# Patient Record
Sex: Female | Born: 1970 | Race: White | Hispanic: No | Marital: Married | Smoking: Never smoker
Health system: Southern US, Community
[De-identification: ages and names within clinical notes are randomized; demographics above are authoritative.]

## PROBLEM LIST (undated history)

## (undated) DIAGNOSIS — N83209 Unspecified ovarian cyst, unspecified side: Secondary | ICD-10-CM

## (undated) DIAGNOSIS — R1032 Left lower quadrant pain: Secondary | ICD-10-CM

## (undated) DIAGNOSIS — Z9889 Other specified postprocedural states: Secondary | ICD-10-CM

## (undated) DIAGNOSIS — J9801 Acute bronchospasm: Secondary | ICD-10-CM

## (undated) DIAGNOSIS — R112 Nausea with vomiting, unspecified: Secondary | ICD-10-CM

## (undated) DIAGNOSIS — Z87442 Personal history of urinary calculi: Secondary | ICD-10-CM

## (undated) DIAGNOSIS — R519 Headache, unspecified: Secondary | ICD-10-CM

## (undated) DIAGNOSIS — R5383 Other fatigue: Secondary | ICD-10-CM

## (undated) DIAGNOSIS — M797 Fibromyalgia: Secondary | ICD-10-CM

## (undated) DIAGNOSIS — R12 Heartburn: Secondary | ICD-10-CM

## (undated) DIAGNOSIS — K219 Gastro-esophageal reflux disease without esophagitis: Secondary | ICD-10-CM

## (undated) DIAGNOSIS — N390 Urinary tract infection, site not specified: Secondary | ICD-10-CM

## (undated) DIAGNOSIS — T8859XA Other complications of anesthesia, initial encounter: Secondary | ICD-10-CM

## (undated) DIAGNOSIS — I10 Essential (primary) hypertension: Secondary | ICD-10-CM

## (undated) DIAGNOSIS — N809 Endometriosis, unspecified: Secondary | ICD-10-CM

## (undated) DIAGNOSIS — G4730 Sleep apnea, unspecified: Secondary | ICD-10-CM

## (undated) DIAGNOSIS — Z8489 Family history of other specified conditions: Secondary | ICD-10-CM

## (undated) DIAGNOSIS — T4145XA Adverse effect of unspecified anesthetic, initial encounter: Secondary | ICD-10-CM

## (undated) HISTORY — DX: Heartburn: R12

## (undated) HISTORY — DX: Urinary tract infection, site not specified: N39.0

## (undated) HISTORY — PX: ESOPHAGOGASTRODUODENOSCOPY: SHX1529

## (undated) HISTORY — DX: Left lower quadrant pain: R10.32

## (undated) HISTORY — DX: Acute bronchospasm: J98.01

## (undated) HISTORY — PX: KNEE ARTHROSCOPY: SHX127

## (undated) HISTORY — PX: BUNIONECTOMY: SHX129

## (undated) HISTORY — DX: Other fatigue: R53.83

## (undated) HISTORY — DX: Gastro-esophageal reflux disease without esophagitis: K21.9

---

## 2005-11-17 ENCOUNTER — Ambulatory Visit: Payer: Self-pay

## 2007-06-28 ENCOUNTER — Ambulatory Visit: Payer: Self-pay | Admitting: Internal Medicine

## 2008-09-25 ENCOUNTER — Ambulatory Visit: Payer: Self-pay

## 2009-11-25 ENCOUNTER — Ambulatory Visit: Payer: Self-pay

## 2010-10-22 ENCOUNTER — Emergency Department: Payer: Self-pay | Admitting: Emergency Medicine

## 2010-11-26 ENCOUNTER — Ambulatory Visit: Payer: Self-pay

## 2011-08-09 ENCOUNTER — Ambulatory Visit: Payer: Self-pay | Admitting: Unknown Physician Specialty

## 2011-08-11 LAB — PATHOLOGY REPORT

## 2012-12-06 LAB — HM PAP SMEAR: HM PAP: NEGATIVE

## 2014-04-15 ENCOUNTER — Ambulatory Visit: Payer: Self-pay | Admitting: Obstetrics and Gynecology

## 2015-01-01 LAB — TSH: TSH: 1.2 u[IU]/mL (ref <=5.90)

## 2015-01-01 LAB — LIPID PANEL
Cholesterol: 172 mg/dL (ref 0–200)
HDL: 53 mg/dL (ref 35–70)
LDL CALC: 103 mg/dL
Triglycerides: 78 mg/dL (ref 40–160)

## 2015-01-01 LAB — BASIC METABOLIC PANEL: Glucose: 89 mg/dL

## 2015-03-15 ENCOUNTER — Encounter: Payer: Self-pay | Admitting: *Deleted

## 2015-03-17 ENCOUNTER — Ambulatory Visit: Payer: Self-pay | Admitting: Obstetrics and Gynecology

## 2015-03-18 ENCOUNTER — Ambulatory Visit: Payer: Self-pay | Admitting: Obstetrics and Gynecology

## 2015-03-23 ENCOUNTER — Ambulatory Visit (INDEPENDENT_AMBULATORY_CARE_PROVIDER_SITE_OTHER): Payer: BC Managed Care – PPO | Admitting: Obstetrics and Gynecology

## 2015-03-23 ENCOUNTER — Encounter: Payer: Self-pay | Admitting: Obstetrics and Gynecology

## 2015-03-23 VITALS — BP 123/79 | HR 62 | Ht 63.0 in | Wt 197.5 lb

## 2015-03-23 DIAGNOSIS — E559 Vitamin D deficiency, unspecified: Secondary | ICD-10-CM | POA: Diagnosis not present

## 2015-03-23 DIAGNOSIS — M2550 Pain in unspecified joint: Secondary | ICD-10-CM

## 2015-03-23 DIAGNOSIS — E669 Obesity, unspecified: Secondary | ICD-10-CM | POA: Diagnosis not present

## 2015-03-23 DIAGNOSIS — K219 Gastro-esophageal reflux disease without esophagitis: Secondary | ICD-10-CM | POA: Insufficient documentation

## 2015-03-23 MED ORDER — ASPIRIN EC 81 MG PO TBEC: 81.0000 mg | DELAYED_RELEASE_TABLET | Freq: Every day | ORAL | Status: DC

## 2015-03-23 MED ORDER — DICLOFENAC POTASSIUM 50 MG PO TABS: 50.0000 mg | ORAL_TABLET | Freq: Three times a day (TID) | ORAL | Status: DC

## 2015-03-23 MED ORDER — CYANOCOBALAMIN 1000 MCG/ML IJ SOLN: 1000.0000 ug | INTRAMUSCULAR | Status: DC

## 2015-03-23 MED ORDER — SCOPOLAMINE 1 MG/3DAYS TD PT72: 1.0000 | MEDICATED_PATCH | TRANSDERMAL | Status: DC

## 2015-03-23 MED ORDER — PHENTERMINE HCL 37.5 MG PO TABS: 37.5000 mg | ORAL_TABLET | Freq: Every day | ORAL | Status: DC

## 2015-03-23 NOTE — Progress Notes (Signed)
Subjective:     Patient ID: Shannon Ross, female   DOB: 06/07/1971, 44 y.o.   MRN: [id number]  HPI Reports desire to restart weight loss program. Mother recently had stroke and multiple old TIAs noted. Understands importance of health maintnance.  Review of Systems Reports worsening GERD since weight is back on. Also has moderate joint pain and stiffness with increase leg achiness in am.    Objective:   Physical Exam A&O x4 Well groomed obese female not in distress. No joint swelling noted    Assessment:     Obesity, strong interests in weight loss Generalized am joint pain GERD     Plan:     Restart B12 and adipex- rx given  To reviewed cholesterol in diet on American Heart association website.  Diclofinac 50mg  tid prn for joint pain  Nymir Ringler Ines BloomerBurr, CNM

## 2015-03-24 ENCOUNTER — Telehealth: Payer: Self-pay | Admitting: *Deleted

## 2015-03-24 LAB — VITAMIN B12: Vitamin B-12: 748 pg/mL (ref 211–946)

## 2015-03-24 LAB — RHEUMATOID FACTOR: RHEUMATOID FACTOR: 7.1 [IU]/mL (ref 0.0–13.9)

## 2015-03-24 NOTE — Telephone Encounter (Signed)
-----   Message from Ulyses AmorMelody N Burr, [place] sent at 03/24/2015  8:13 AM EDT ----- Please let her know B12 levels are good and rhuematiod factor was negative

## 2015-03-24 NOTE — Telephone Encounter (Signed)
Notified pt of results 

## 2015-04-20 ENCOUNTER — Ambulatory Visit (INDEPENDENT_AMBULATORY_CARE_PROVIDER_SITE_OTHER): Payer: BC Managed Care – PPO | Admitting: Obstetrics and Gynecology

## 2015-04-20 VITALS — BP 131/82 | HR 76 | Ht 63.0 in | Wt 190.4 lb

## 2015-04-20 DIAGNOSIS — E669 Obesity, unspecified: Secondary | ICD-10-CM | POA: Diagnosis not present

## 2015-04-20 MED ORDER — CYANOCOBALAMIN 1000 MCG/ML IJ SOLN
1000.0000 ug | Freq: Once | INTRAMUSCULAR | Status: AC
  Administered 2015-04-20: 1000 ug via INTRAMUSCULAR

## 2015-04-20 NOTE — Progress Notes (Cosign Needed)
Patient ID: Shannon Ross, female   DOB: July 15, 1971, 44 y.o.   MRN: [id number] Pt voices no complaints. Taking medications without adverse side effects.

## 2015-05-18 ENCOUNTER — Ambulatory Visit (INDEPENDENT_AMBULATORY_CARE_PROVIDER_SITE_OTHER): Payer: BC Managed Care – PPO | Admitting: Obstetrics and Gynecology

## 2015-05-18 VITALS — BP 117/80 | HR 79 | Ht 63.0 in | Wt 189.2 lb

## 2015-05-18 DIAGNOSIS — E669 Obesity, unspecified: Secondary | ICD-10-CM

## 2015-05-18 MED ORDER — CYANOCOBALAMIN 1000 MCG/ML IJ SOLN
1000.0000 ug | Freq: Once | INTRAMUSCULAR | Status: AC
  Administered 2015-05-18: 1000 ug via INTRAMUSCULAR

## 2015-05-18 NOTE — Progress Notes (Cosign Needed)
Patient ID: Shannon Ross, female   DOB: [birth year], 45 y.o.   MRN: [id number] Pt here for weight, B/P, B-12 injection.  Weight loss of 1 lb since last visit. No c/o side effects of medication.

## 2015-05-19 ENCOUNTER — Other Ambulatory Visit: Payer: Self-pay | Admitting: Obstetrics and Gynecology

## 2015-05-27 ENCOUNTER — Ambulatory Visit (INDEPENDENT_AMBULATORY_CARE_PROVIDER_SITE_OTHER): Payer: BC Managed Care – PPO | Admitting: Obstetrics and Gynecology

## 2015-05-27 VITALS — BP 140/73 | HR 93 | Temp 97.8°F | Ht 63.0 in | Wt 190.1 lb

## 2015-05-27 DIAGNOSIS — R3915 Urgency of urination: Secondary | ICD-10-CM | POA: Diagnosis not present

## 2015-05-27 DIAGNOSIS — N390 Urinary tract infection, site not specified: Secondary | ICD-10-CM

## 2015-05-27 DIAGNOSIS — R102 Pelvic and perineal pain: Secondary | ICD-10-CM

## 2015-05-27 LAB — POCT URINALYSIS DIPSTICK
BILIRUBIN UA: NEGATIVE
GLUCOSE UA: NEGATIVE
Ketones, UA: NEGATIVE
Nitrite, UA: NEGATIVE
Protein, UA: NEGATIVE
SPEC GRAV UA: 1.025
Urobilinogen, UA: NEGATIVE
pH, UA: 6

## 2015-05-27 MED ORDER — NITROFURANTOIN MONOHYD MACRO 100 MG PO CAPS: 100.0000 mg | ORAL_CAPSULE | Freq: Two times a day (BID) | ORAL | Status: DC

## 2015-05-27 NOTE — Progress Notes (Cosign Needed)
Patient ID: Shannon Ross, female   DOB: 1971/07/17, 44 y.o.   MRN: [id number] Pt comes to office to have urinalysis done per MNB. Pt c/o urinary urgency, pelvic pain that started about mid morning all of a sudden. Pt prone to UTI'S. Given urogesic for pain and MNB will address upon returning to office today.

## 2015-05-29 LAB — URINE CULTURE

## 2015-05-31 ENCOUNTER — Telehealth: Payer: Self-pay | Admitting: Obstetrics and Gynecology

## 2015-05-31 NOTE — Telephone Encounter (Signed)
MNB spoke w/pt she is to finish current antibotic and to drop off UA in 3 days

## 2015-05-31 NOTE — Telephone Encounter (Signed)
Severe abdominal pain, not having urge to go, wanted to know the results of her urinalysis. Pt would like a call back.

## 2015-06-04 ENCOUNTER — Other Ambulatory Visit: Payer: Self-pay | Admitting: Obstetrics and Gynecology

## 2015-06-04 ENCOUNTER — Telehealth: Payer: Self-pay | Admitting: Obstetrics and Gynecology

## 2015-06-04 MED ORDER — AMOXICILLIN-POT CLAVULANATE 875-125 MG PO TABS: 1.0000 | ORAL_TABLET | Freq: Two times a day (BID) | ORAL | Status: DC

## 2015-06-04 NOTE — Telephone Encounter (Signed)
PT CALLED AND SHE FINISHED THE ANTIBIOTIC YESTERDAY AND SHE IS STILL HAVING BURNING WHEN SHE URINATES AND ABDOMINAL PAIN. Shannon Ross

## 2015-06-15 ENCOUNTER — Ambulatory Visit: Payer: BC Managed Care – PPO

## 2015-06-16 ENCOUNTER — Ambulatory Visit (INDEPENDENT_AMBULATORY_CARE_PROVIDER_SITE_OTHER): Payer: BC Managed Care – PPO | Admitting: Obstetrics and Gynecology

## 2015-06-16 VITALS — BP 118/72 | HR 73 | Wt 186.8 lb

## 2015-06-16 DIAGNOSIS — E669 Obesity, unspecified: Secondary | ICD-10-CM | POA: Diagnosis not present

## 2015-06-16 MED ORDER — CYANOCOBALAMIN 1000 MCG/ML IJ SOLN
1000.0000 ug | Freq: Once | INTRAMUSCULAR | Status: AC
  Administered 2015-06-16: 1000 ug via INTRAMUSCULAR

## 2015-06-16 NOTE — Progress Notes (Cosign Needed)
Pt is here for wt, bp check, b-12 inj She is having some edema in her LE, advised pt i would discuss with MNB about fluid pill  Wt 186.8lb

## 2015-06-29 ENCOUNTER — Other Ambulatory Visit (INDEPENDENT_AMBULATORY_CARE_PROVIDER_SITE_OTHER): Payer: BC Managed Care – PPO | Admitting: Obstetrics and Gynecology

## 2015-06-29 DIAGNOSIS — R350 Frequency of micturition: Secondary | ICD-10-CM

## 2015-06-29 LAB — POCT URINALYSIS DIPSTICK
BILIRUBIN UA: NEGATIVE
GLUCOSE UA: NEGATIVE
KETONES UA: NEGATIVE
LEUKOCYTES UA: NEGATIVE
NITRITE UA: NEGATIVE
Spec Grav, UA: 1.025
Urobilinogen, UA: 0.2
pH, UA: 6

## 2015-06-29 NOTE — Progress Notes (Cosign Needed)
Pt is having frequent urination, lots of pressure when urinating started this am @ 8:30 am  Called pt advised we would sent out culture, didn't see a infection today, she is going to use Urogesic Blue until culture comes back

## 2015-06-30 ENCOUNTER — Other Ambulatory Visit: Payer: Self-pay | Admitting: *Deleted

## 2015-06-30 MED ORDER — CIPROFLOXACIN HCL 500 MG PO TABS: 500.0000 mg | ORAL_TABLET | Freq: Two times a day (BID) | ORAL | Status: DC

## 2015-07-01 ENCOUNTER — Telehealth: Payer: Self-pay | Admitting: *Deleted

## 2015-07-01 LAB — URINE CULTURE

## 2015-07-01 NOTE — Telephone Encounter (Signed)
-----   Message from Ulyses AmorMelody N Burr, [place] sent at 07/01/2015  9:51 AM EDT ----- Please let her know her urine culture rsults- not the e cloi again, which is good, antibiotic should be helping

## 2015-07-01 NOTE — Telephone Encounter (Signed)
Notified pt of results, she isnt feeling much better, advised pt to call our office 07/02/15 if not any better

## 2015-07-02 ENCOUNTER — Other Ambulatory Visit: Payer: Self-pay | Admitting: *Deleted

## 2015-07-02 MED ORDER — LEVOFLOXACIN 500 MG PO TABS: 500.0000 mg | ORAL_TABLET | Freq: Every day | ORAL | Status: DC

## 2015-07-07 ENCOUNTER — Other Ambulatory Visit: Payer: Self-pay | Admitting: Obstetrics and Gynecology

## 2015-07-07 ENCOUNTER — Telehealth: Payer: Self-pay | Admitting: *Deleted

## 2015-07-07 DIAGNOSIS — R1032 Left lower quadrant pain: Secondary | ICD-10-CM

## 2015-07-07 DIAGNOSIS — N390 Urinary tract infection, site not specified: Secondary | ICD-10-CM

## 2015-07-07 NOTE — Telephone Encounter (Signed)
Notified pt of MNB plan she is in agreement and will come in 07/08/15 for ua/culture

## 2015-07-07 NOTE — Telephone Encounter (Signed)
Pt is still c/o frequent urination, she is having pain in her LL abdomen, states the pain is so bad it makes her very nauseated She has 1 more dose of Levaquin left

## 2015-07-07 NOTE — Telephone Encounter (Signed)
Please let her know i ordered a pelvic and renal u/s at the hospital, also put in a urology referral. Lastly want her to drop off urine to be sent for culture Friday

## 2015-07-08 ENCOUNTER — Telehealth: Payer: Self-pay | Admitting: Obstetrics and Gynecology

## 2015-07-08 ENCOUNTER — Ambulatory Visit (INDEPENDENT_AMBULATORY_CARE_PROVIDER_SITE_OTHER): Payer: BC Managed Care – PPO | Admitting: Obstetrics and Gynecology

## 2015-07-08 DIAGNOSIS — R350 Frequency of micturition: Secondary | ICD-10-CM

## 2015-07-08 LAB — POCT URINALYSIS DIPSTICK
GLUCOSE UA: NEGATIVE
Ketones, UA: NEGATIVE
LEUKOCYTES UA: NEGATIVE
NITRITE UA: NEGATIVE
PH UA: 5
Spec Grav, UA: 1.025
Urobilinogen, UA: 0.2

## 2015-07-08 NOTE — Progress Notes (Cosign Needed)
Pt is here for drop off UA she is still having pressure and the urge to urinate, MNB advised pt to come and drop off ua

## 2015-07-10 LAB — URINE CULTURE: ORGANISM ID, BACTERIA: NO GROWTH

## 2015-07-15 ENCOUNTER — Ambulatory Visit: Payer: BC Managed Care – PPO

## 2015-07-15 NOTE — Telephone Encounter (Signed)
Notified pt of results 

## 2015-07-15 NOTE — Telephone Encounter (Signed)
-----   Message from Ulyses AmorMelody N Burr, [place] sent at 07/13/2015 10:10 AM EDT ----- Please let her know her urine culture is now negative

## 2015-07-19 ENCOUNTER — Other Ambulatory Visit: Payer: Self-pay | Admitting: Obstetrics and Gynecology

## 2015-07-19 DIAGNOSIS — N390 Urinary tract infection, site not specified: Secondary | ICD-10-CM

## 2015-07-19 DIAGNOSIS — R1032 Left lower quadrant pain: Secondary | ICD-10-CM

## 2015-07-20 ENCOUNTER — Ambulatory Visit
Admission: RE | Admit: 2015-07-20 | Discharge: 2015-07-20 | Disposition: A | Discharge status: Home / Self Care | Payer: BC Managed Care – PPO | Source: Ambulatory Visit | Attending: Obstetrics and Gynecology | Admitting: Obstetrics and Gynecology

## 2015-07-20 ENCOUNTER — Ambulatory Visit: Payer: BC Managed Care – PPO

## 2015-07-20 DIAGNOSIS — N2889 Other specified disorders of kidney and ureter: Secondary | ICD-10-CM | POA: Insufficient documentation

## 2015-07-20 DIAGNOSIS — N390 Urinary tract infection, site not specified: Secondary | ICD-10-CM

## 2015-07-20 DIAGNOSIS — R1032 Left lower quadrant pain: Secondary | ICD-10-CM | POA: Diagnosis present

## 2015-07-20 DIAGNOSIS — N83291 Other ovarian cyst, right side: Secondary | ICD-10-CM | POA: Insufficient documentation

## 2015-07-20 DIAGNOSIS — Z975 Presence of (intrauterine) contraceptive device: Secondary | ICD-10-CM | POA: Insufficient documentation

## 2015-07-21 ENCOUNTER — Ambulatory Visit: Payer: BC Managed Care – PPO | Admitting: Urology

## 2015-07-27 ENCOUNTER — Ambulatory Visit (INDEPENDENT_AMBULATORY_CARE_PROVIDER_SITE_OTHER): Payer: BC Managed Care – PPO | Admitting: Obstetrics and Gynecology

## 2015-07-27 ENCOUNTER — Encounter: Payer: Self-pay | Admitting: Obstetrics and Gynecology

## 2015-07-27 VITALS — BP 123/79 | HR 73 | Ht 63.0 in | Wt 187.3 lb

## 2015-07-27 DIAGNOSIS — N949 Unspecified condition associated with female genital organs and menstrual cycle: Secondary | ICD-10-CM | POA: Diagnosis not present

## 2015-07-27 DIAGNOSIS — G8929 Other chronic pain: Secondary | ICD-10-CM

## 2015-07-27 DIAGNOSIS — Z8489 Family history of other specified conditions: Secondary | ICD-10-CM

## 2015-07-27 DIAGNOSIS — N83299 Other ovarian cyst, unspecified side: Secondary | ICD-10-CM | POA: Diagnosis not present

## 2015-07-27 DIAGNOSIS — N83201 Unspecified ovarian cyst, right side: Secondary | ICD-10-CM

## 2015-07-27 DIAGNOSIS — R102 Pelvic and perineal pain: Secondary | ICD-10-CM | POA: Diagnosis not present

## 2015-07-27 DIAGNOSIS — Z8742 Personal history of other diseases of the female genital tract: Secondary | ICD-10-CM | POA: Diagnosis not present

## 2015-07-27 DIAGNOSIS — Z842 Family history of other diseases of the genitourinary system: Secondary | ICD-10-CM

## 2015-07-27 NOTE — Patient Instructions (Signed)
1.  History and physical findings are very suspicious for endometriosis. 2.  Recommend laparoscopy with peritoneal biopsies to confirm the diagnosis. 3.  Tylenol for analgesia. 4.  Return for preop appointment. 5.  ACOG pamphlets on endometriosis and laparoscopy are given.

## 2015-07-27 NOTE — Progress Notes (Signed)
Patient ID: Danise MinaMelanie H Gutknecht, female   DOB: 03/10/1971, 44 y.o.   MRN: [id number] U/s results  rt ovarian cyst   pelvic pain x 3 months qd  Chief complaint: 1.  Follow-up on ultrasound. 2.  Pelvic pain, chronic  Patient is a 44 year old female, para [id number], Using Mirena IUD for contraception 4 years, amenorrheic for 4 years until recent development of abnormal uterine bleeding,Who presents for follow-up on pelvic ultrasound ordered because of chronic pelvic pain evaluation. Patient has been experiencing left lower quadrant pain for the past 3 months, which is steady; previously have been occurring off and on for 8 months. Recent ultrasound demonstrated complex right ovarian cyst; normal left adnexa  GYN history: Menarche age 44. Long history of severe dysmenorrhea. Dysmenorrhea eased after first child was born; subsequently returned. Dysmenorrhea is described as Central cramping, With associated sharp exacerbations on the left side that can last for minutes and is associated with nausea at times.  Now has constant component of pelvic discomfort that is worse with activity.  Pain improves coming off her feet and using Tylenol analgesics.  Patient cannot take nonsteroidals. Patient does not report deep thrusting dyspareunia. Patient had previously used birth control pills but stopped them because of her failed conception Producing her first child. Amenorrhea for 4 years with Mirena use;Now New onset Bleeding with associated pain. Family history of endometriosis-Mother  History of recurrent UTIs, 4-6 per year; no urology evaluation  Objective:BP 123/79 mmHg  Pulse 73  Ht 5\' 3"  (1.6 m)  Wt 187 lb 4.8 oz (84.959 kg)  BMI 33.19 kg/m2  LMP 07/24/2015 Pleasant white female in no acute distress.  She is alert and oriented. Back no CVA tenderness. Abdomen: Soft, nontender, without organomegaly. Pelvic exam: External genitalia normal BUS normal. Vagina normal Cervix- no gross lesions; 4/4   Cervical motion tenderness. Uterus- tender, 4/4, normal size and shape, midplane Adnexa-left lower quadrant tenderness 4/4; right lower quadrant, nontender. Rectovaginal-normal sphincter tone,; no masses; no uterosacral nodularity  ASSESSMENT: 1.Chronic pelvic pain, with recent exacerbation, not well controlled with oral analgesics.Symptoms midline and left-sided 2.  Suspect endometriosis, refractory to Mirena IUD. 3.New onset abnormal uterine bleeding after 4 years of amenorrhea from Mirena IUD use, likely related to endometriosis 4.  Complex right ovarian cyst  PLAN: 1.  Laparoscopy with peritoneal biopsies. 2.  Return for preop appointment. 3.  Continue with Tylenol for pain relief.  A total of 15 minutes were spent face-to-face with the patient during this encounter and over half of that time dealt with counseling and coordination of care.  Herold HarmsMartin A Samarie Pinder, MD

## 2015-07-29 DIAGNOSIS — Z842 Family history of other diseases of the genitourinary system: Secondary | ICD-10-CM | POA: Insufficient documentation

## 2015-07-29 DIAGNOSIS — R102 Pelvic and perineal pain: Secondary | ICD-10-CM

## 2015-07-29 DIAGNOSIS — G8929 Other chronic pain: Secondary | ICD-10-CM | POA: Insufficient documentation

## 2015-07-29 DIAGNOSIS — N83201 Unspecified ovarian cyst, right side: Secondary | ICD-10-CM | POA: Insufficient documentation

## 2015-07-29 MED ORDER — TRAMADOL HCL 50 MG PO TABS: 50.0000 mg | ORAL_TABLET | Freq: Two times a day (BID) | ORAL | Status: DC

## 2015-07-29 NOTE — Addendum Note (Signed)
Addended by: Marchelle FolksMILLER, Salahuddin Arismendez G on: 07/29/2015 04:33 PM   Modules accepted: Orders

## 2015-08-11 ENCOUNTER — Ambulatory Visit (INDEPENDENT_AMBULATORY_CARE_PROVIDER_SITE_OTHER): Payer: BC Managed Care – PPO | Admitting: Obstetrics and Gynecology

## 2015-08-11 ENCOUNTER — Encounter: Payer: Self-pay | Admitting: Obstetrics and Gynecology

## 2015-08-11 ENCOUNTER — Encounter
Admission: RE | Admit: 2015-08-11 | Discharge: 2015-08-11 | Disposition: A | Discharge status: Home / Self Care | Payer: BC Managed Care – PPO | Source: Ambulatory Visit | Attending: Obstetrics and Gynecology | Admitting: Obstetrics and Gynecology

## 2015-08-11 VITALS — BP 132/88 | HR 59 | Ht 63.0 in | Wt 185.3 lb

## 2015-08-11 DIAGNOSIS — Z01810 Encounter for preprocedural cardiovascular examination: Secondary | ICD-10-CM | POA: Insufficient documentation

## 2015-08-11 DIAGNOSIS — N83201 Unspecified ovarian cyst, right side: Secondary | ICD-10-CM

## 2015-08-11 DIAGNOSIS — Z01812 Encounter for preprocedural laboratory examination: Secondary | ICD-10-CM | POA: Insufficient documentation

## 2015-08-11 DIAGNOSIS — Z8489 Family history of other specified conditions: Secondary | ICD-10-CM

## 2015-08-11 DIAGNOSIS — Z842 Family history of other diseases of the genitourinary system: Secondary | ICD-10-CM

## 2015-08-11 DIAGNOSIS — Z01818 Encounter for other preprocedural examination: Secondary | ICD-10-CM

## 2015-08-11 DIAGNOSIS — R102 Pelvic and perineal pain: Secondary | ICD-10-CM

## 2015-08-11 HISTORY — DX: Other complications of anesthesia, initial encounter: T88.59XA

## 2015-08-11 HISTORY — DX: Other specified postprocedural states: Z98.890

## 2015-08-11 HISTORY — DX: Nausea with vomiting, unspecified: R11.2

## 2015-08-11 HISTORY — DX: Adverse effect of unspecified anesthetic, initial encounter: T41.45XA

## 2015-08-11 LAB — RAPID HIV SCREEN (HIV 1/2 AB+AG)
HIV 1/2 Antibodies: NONREACTIVE
HIV-1 P24 ANTIGEN - HIV24: NONREACTIVE

## 2015-08-11 LAB — CBC WITH DIFFERENTIAL/PLATELET
Basophils Absolute: 0.1 10*3/uL (ref 0–0.1)
Basophils Relative: 1 %
EOS ABS: 0.2 10*3/uL (ref 0–0.7)
EOS PCT: 3 %
HCT: 45.3 % (ref 35.0–47.0)
Hemoglobin: 15.5 g/dL (ref 12.0–16.0)
LYMPHS ABS: 2 10*3/uL (ref 1.0–3.6)
Lymphocytes Relative: 24 %
MCH: 31 pg (ref 26.0–34.0)
MCHC: 34.2 g/dL (ref 32.0–36.0)
MCV: 90.6 fL (ref 80.0–100.0)
MONO ABS: 0.6 10*3/uL (ref 0.2–0.9)
Monocytes Relative: 7 %
Neutro Abs: 5.4 10*3/uL (ref 1.4–6.5)
Neutrophils Relative %: 65 %
PLATELETS: 167 10*3/uL (ref 150–440)
RBC: 4.99 MIL/uL (ref 3.80–5.20)
RDW: 12.5 % (ref 11.5–14.5)
WBC: 8.3 10*3/uL (ref 3.6–11.0)

## 2015-08-11 NOTE — Progress Notes (Signed)
Patient ID: Shannon Ross, female   DOB: 01/17/1971, 44 y.o.   MRN: [id number] Pre-op-lap with bx  Subjective: PREOPERATIVE HISTORY AND PHYSICAL  Chief complaint: 1.  Chronic pelvic . Date of surgery: 08/16/2015   Patient is a 44 y.o. G2P1002female scheduled for Laparoscopy with peritoneal biopsies. Indications for procedure are Chronic pelvic pain.  Patient is a 44-year-old female, para 1102, Using Mirena IUD for contraception 4 years, amenorrheic for 4 years until recent development of abnormal uterine bleeding,Who presents for surgical evaluation. Patient has been experiencing left lower quadrant pain for the past 3 months, which is steady; previously have been occurring off and on for 8 months. Recent ultrasound demonstrated complex right ovarian cyst; normal left adnexa  GYN history: Menarche age 15. Long history of severe dysmenorrhea. Dysmenorrhea eased after first child was born; subsequently returned. Dysmenorrhea is described as Central cramping, With associated sharp exacerbations on the left side that can last for minutes and is associated with nausea at times. Now has constant component of pelvic discomfort that is worse with activity. Pain improves coming off her feet and using Tylenol analgesics. Patient cannot take nonsteroidals. Patient does not report deep thrusting dyspareunia. Patient had previously used birth control pills but stopped them because of her failed conception Producing her first child. Amenorrhea for 4 years with Mirena use;Now New onset Bleeding with associated pain. Family history of endometriosis-Mother  History of recurrent UTIs, 4-6 per year; no urology evaluation  Past Medical History  Diagnosis Date  . Fatigue   . GERD (gastroesophageal reflux disease)   . Cough due to bronchospasm    Past Surgical History  Procedure Laterality Date  . None      Family history: Noncontributory Social history: Review of systems: No recent history of  upper respiratory infection, Antibiotic use, fevers, chills or sweats. No history of coagulopathy. No chest pain, shortness of breath, history of asthma   Objective:BP 123/79 mmHg  Pulse 73  Ht 5' 3" (1.6 m)  Wt 187 lb 4.8 oz (84.959 kg)  BMI 33.19 kg/m2  LMP 07/24/2015 Pleasant white female in no acute distress. She is alert and oriented.  HEENT exam: Normocephalic, atraumatic.Oropharynx clear. Neck: Supple without thyromegaly or adenopathy. Lungs: Clear. Heart: Regular rate and rhythm without murmur Back no CVA tenderness. Abdomen: Soft, nontender, without organomegaly. Pelvic exam: External genitalia normal BUS normal. Vagina normal Cervix- no gross lesions; 4/4 Cervical motion tenderness. Uterus- tender, 4/4, normal size and shape, midplane Adnexa-left lower quadrant tenderness 4/4; right lower quadrant, nontender. Rectovaginal-normal sphincter tone,; no masses; no uterosacral nodularity  ASSESSMENT: 1.Chronic pelvic pain, with recent exacerbation, not well controlled with oral analgesics.Symptoms midline and left-sided 2. Suspect endometriosis, refractory to Mirena IUD. 3.New onset abnormal uterine bleeding after 4 years of amenorrhea from Mirena IUD use, likely related to endometriosis 4. Complex right ovarian cyst  PLAN: 1. Laparoscopy with peritoneal biopsies.    Counseling: The patient is to undergo laparoscopy with peritoneal biopsies for evaluation of chronic pelvic pain.  She is understanding of the planned procedure and is aware of and is accepting of all surgical risks which include but are not limited to bleeding, infection, pelvic organ inury with need for repair, both blood disorders, anesthesia risks, etc.  All questions are answered.  Informed consent is given.  Patient is ready and willing to proceed with surgery as scheduled.  Martin A Defrancesco, MD  Note: This dictation was prepared with Dragon dictation along with smaller phrase technology. Any  transcriptional errors that   result from this process are unintentional.

## 2015-08-11 NOTE — H&P (Addendum)
Patient ID: Shannon Ross, female   DOB: 10/13/1970, 44 y.o.   MRN: [id number] Pre-op-lap with bx  Subjective: PREOPERATIVE HISTORY AND PHYSICAL  Chief complaint: 1.  Chronic pelvic . Date of surgery: 08/16/2015   Patient is a 44 y.o. G2P1002female scheduled for Laparoscopy with peritoneal biopsies. Indications for procedure are Chronic pelvic pain.  Patient is a 44-year-old female, para 1102, Using Mirena IUD for contraception 4 years, amenorrheic for 4 years until recent development of abnormal uterine bleeding,Who presents for surgical evaluation. Patient has been experiencing left lower quadrant pain for the past 3 months, which is steady; previously have been occurring off and on for 8 months. Recent ultrasound demonstrated complex right ovarian cyst; normal left adnexa  GYN history: Menarche age 15. Long history of severe dysmenorrhea. Dysmenorrhea eased after first child was born; subsequently returned. Dysmenorrhea is described as Central cramping, With associated sharp exacerbations on the left side that can last for minutes and is associated with nausea at times. Now has constant component of pelvic discomfort that is worse with activity. Pain improves coming off her feet and using Tylenol analgesics. Patient cannot take nonsteroidals. Patient does not report deep thrusting dyspareunia. Patient had previously used birth control pills but stopped them because of her failed conception Producing her first child. Amenorrhea for 4 years with Mirena use;Now New onset Bleeding with associated pain. Family history of endometriosis-Mother  History of recurrent UTIs, 4-6 per year; no urology evaluation  Past Medical History  Diagnosis Date  . Fatigue   . GERD (gastroesophageal reflux disease)   . Cough due to bronchospasm    Past Surgical History  Procedure Laterality Date  . None      Family history: Noncontributory Social history: Review of systems: No recent history of  upper respiratory infection, Antibiotic use, fevers, chills or sweats. No history of coagulopathy. No chest pain, shortness of breath, history of asthma   Objective:BP 123/79 mmHg  Pulse 73  Ht 5' 3" (1.6 m)  Wt 187 lb 4.8 oz (84.959 kg)  BMI 33.19 kg/m2  LMP 07/24/2015 Pleasant white female in no acute distress. She is alert and oriented.  HEENT exam: Normocephalic, atraumatic.Oropharynx clear. Neck: Supple without thyromegaly or adenopathy. Lungs: Clear. Heart: Regular rate and rhythm without murmur Back no CVA tenderness. Abdomen: Soft, nontender, without organomegaly. Pelvic exam: External genitalia normal BUS normal. Vagina normal Cervix- no gross lesions; 4/4 Cervical motion tenderness. Uterus- tender, 4/4, normal size and shape, midplane Adnexa-left lower quadrant tenderness 4/4; right lower quadrant, nontender. Rectovaginal-normal sphincter tone,; no masses; no uterosacral nodularity  ASSESSMENT: 1.Chronic pelvic pain, with recent exacerbation, not well controlled with oral analgesics.Symptoms midline and left-sided 2. Suspect endometriosis, refractory to Mirena IUD. 3.New onset abnormal uterine bleeding after 4 years of amenorrhea from Mirena IUD use, likely related to endometriosis 4. Complex right ovarian cyst  PLAN: 1. Laparoscopy with peritoneal biopsies.    Counseling: The patient is to undergo laparoscopy with peritoneal biopsies for evaluation of chronic pelvic pain.  She is understanding of the planned procedure and is aware of and is accepting of all surgical risks which include but are not limited to bleeding, infection, pelvic organ inury with need for repair, both blood disorders, anesthesia risks, etc.  All questions are answered.  Informed consent is given.  Patient is ready and willing to proceed with surgery as scheduled.  Lizania Bouchard A Asah Lamay, MD  Note: This dictation was prepared with Dragon dictation along with smaller phrase technology. Any  transcriptional errors that   result from this process are unintentional.

## 2015-08-11 NOTE — Patient Instructions (Signed)
  Your procedure is scheduled on: Monday 08/16/2015 Report to Day Surgery. 2ND FLOOR MEDICAL MALL ENTRANCE   To find out your arrival time please call [phone] between 1PM - 3PM on Friday 08/13/2015.  Remember: Instructions that are not followed completely may result in serious medical risk, up to and including death, or upon the discretion of your surgeon and anesthesiologist your surgery may need to be rescheduled.    __X__ 1. Do not eat food or drink liquids after midnight. No gum chewing or hard candies.     __X__ 2. No Alcohol for 24 hours before or after surgery.   ____ 3. Bring all medications with you on the day of surgery if instructed.    __X__ 4. Notify your doctor if there is any change in your medical condition     (cold, fever, infections).     Do not wear jewelry, make-up, hairpins, clips or nail polish.  Do not wear lotions, powders, or perfumes.   Do not shave 48 hours prior to surgery. Men may shave face and neck.  Do not bring valuables to the hospital.    [facility] is not responsible for any belongings or valuables.               Contacts, dentures or bridgework may not be worn into surgery.  Leave your suitcase in the car. After surgery it may be brought to your room.  For patients admitted to the hospital, discharge time is determined by your                treatment team.   Patients discharged the day of surgery will not be allowed to drive home.   Please read over the following fact sheets that you were given:   Surgical Site Infection Prevention   __X__ Take these medicines the morning of surgery with A SIP OF WATER:    1. OMEPRAZOLE  2.   3.   4.  5.  6.  ____ Fleet Enema (as directed)   __X__ Use CHG Soap as directed  ____ Use inhalers on the day of surgery  ____ Stop metformin 2 days prior to surgery    ____ Take 1/2 of usual insulin dose the night before surgery and none on the morning of surgery.   ____ Stop  Coumadin/Plavix/aspirin on ALREADY STOPPED  ____ Stop Anti-inflammatories on ALREADY STOPPED   ____ Stop supplements until after surgery.    ____ Bring C-Pap to the hospital.

## 2015-08-12 LAB — RPR: RPR Ser Ql: NONREACTIVE

## 2015-08-16 ENCOUNTER — Ambulatory Visit: Payer: BC Managed Care – PPO | Admitting: Certified Registered Nurse Anesthetist

## 2015-08-16 ENCOUNTER — Encounter: Payer: Self-pay | Admitting: *Deleted

## 2015-08-16 ENCOUNTER — Ambulatory Visit
Admission: RE | Admit: 2015-08-16 | Discharge: 2015-08-16 | Disposition: A | Discharge status: Home / Self Care | Payer: BC Managed Care – PPO | Source: Ambulatory Visit | Attending: Obstetrics and Gynecology | Admitting: Obstetrics and Gynecology

## 2015-08-16 ENCOUNTER — Encounter
Admission: RE | Disposition: A | Discharge status: Home / Self Care | Payer: Self-pay | Source: Ambulatory Visit | Attending: Obstetrics and Gynecology

## 2015-08-16 DIAGNOSIS — Z791 Long term (current) use of non-steroidal anti-inflammatories (NSAID): Secondary | ICD-10-CM | POA: Diagnosis not present

## 2015-08-16 DIAGNOSIS — N949 Unspecified condition associated with female genital organs and menstrual cycle: Secondary | ICD-10-CM | POA: Diagnosis not present

## 2015-08-16 DIAGNOSIS — Z87440 Personal history of urinary (tract) infections: Secondary | ICD-10-CM | POA: Diagnosis not present

## 2015-08-16 DIAGNOSIS — Z975 Presence of (intrauterine) contraceptive device: Secondary | ICD-10-CM | POA: Diagnosis not present

## 2015-08-16 DIAGNOSIS — Z79899 Other long term (current) drug therapy: Secondary | ICD-10-CM | POA: Diagnosis not present

## 2015-08-16 DIAGNOSIS — R1032 Left lower quadrant pain: Secondary | ICD-10-CM | POA: Insufficient documentation

## 2015-08-16 DIAGNOSIS — Z842 Family history of other diseases of the genitourinary system: Secondary | ICD-10-CM

## 2015-08-16 DIAGNOSIS — N83201 Unspecified ovarian cyst, right side: Secondary | ICD-10-CM | POA: Insufficient documentation

## 2015-08-16 DIAGNOSIS — G8929 Other chronic pain: Secondary | ICD-10-CM | POA: Insufficient documentation

## 2015-08-16 DIAGNOSIS — Z9889 Other specified postprocedural states: Secondary | ICD-10-CM | POA: Insufficient documentation

## 2015-08-16 DIAGNOSIS — Z7982 Long term (current) use of aspirin: Secondary | ICD-10-CM | POA: Diagnosis not present

## 2015-08-16 DIAGNOSIS — N939 Abnormal uterine and vaginal bleeding, unspecified: Secondary | ICD-10-CM | POA: Diagnosis not present

## 2015-08-16 DIAGNOSIS — R102 Pelvic and perineal pain: Secondary | ICD-10-CM | POA: Diagnosis present

## 2015-08-16 DIAGNOSIS — K219 Gastro-esophageal reflux disease without esophagitis: Secondary | ICD-10-CM | POA: Insufficient documentation

## 2015-08-16 HISTORY — PX: LAPAROSCOPY: SHX197

## 2015-08-16 LAB — POCT PREGNANCY, URINE: Preg Test, Ur: NEGATIVE

## 2015-08-16 SURGERY — LAPAROSCOPY OPERATIVE
Anesthesia: General | Wound class: Clean Contaminated

## 2015-08-16 MED ORDER — SODIUM CHLORIDE 0.9 % IJ SOLN
INTRAMUSCULAR | Status: AC
  Filled 2015-08-16: qty 10

## 2015-08-16 MED ORDER — OXYCODONE HCL 5 MG PO TABS
ORAL_TABLET | ORAL | Status: AC
  Filled 2015-08-16: qty 1

## 2015-08-16 MED ORDER — FAMOTIDINE 20 MG PO TABS
ORAL_TABLET | ORAL | Status: AC
  Administered 2015-08-16: 20 mg via ORAL
  Filled 2015-08-16: qty 1

## 2015-08-16 MED ORDER — PROMETHAZINE HCL 25 MG/ML IJ SOLN
6.2500 mg | Freq: Once | INTRAMUSCULAR | Status: AC
  Administered 2015-08-16: 6.25 mg via INTRAVENOUS

## 2015-08-16 MED ORDER — LACTATED RINGERS IV SOLN: INTRAVENOUS | Status: DC

## 2015-08-16 MED ORDER — ROCURONIUM BROMIDE 100 MG/10ML IV SOLN
INTRAVENOUS | Status: DC | PRN
  Administered 2015-08-16: 5 mg via INTRAVENOUS
  Administered 2015-08-16: 30 mg via INTRAVENOUS

## 2015-08-16 MED ORDER — SODIUM CHLORIDE 0.9 % IV SOLN
INTRAVENOUS | Status: DC | PRN
  Administered 2015-08-16: 09:00:00 via INTRAVENOUS

## 2015-08-16 MED ORDER — FENTANYL CITRATE (PF) 100 MCG/2ML IJ SOLN
INTRAMUSCULAR | Status: AC
  Administered 2015-08-16: 25 ug via INTRAVENOUS
  Filled 2015-08-16: qty 2

## 2015-08-16 MED ORDER — MIDAZOLAM HCL 2 MG/2ML IJ SOLN
INTRAMUSCULAR | Status: DC | PRN
  Administered 2015-08-16: 2 mg via INTRAVENOUS

## 2015-08-16 MED ORDER — OXYCODONE-ACETAMINOPHEN 5-325 MG PO TABS: 1.0000 | ORAL_TABLET | ORAL | Status: DC | PRN

## 2015-08-16 MED ORDER — ACETAMINOPHEN 10 MG/ML IV SOLN
INTRAVENOUS | Status: AC
  Filled 2015-08-16: qty 100

## 2015-08-16 MED ORDER — OXYCODONE HCL 5 MG PO TABS
5.0000 mg | ORAL_TABLET | Freq: Once | ORAL | Status: AC | PRN
  Administered 2015-08-16: 5 mg via ORAL

## 2015-08-16 MED ORDER — FENTANYL CITRATE (PF) 100 MCG/2ML IJ SOLN
INTRAMUSCULAR | Status: DC | PRN
  Administered 2015-08-16 (×4): 50 ug via INTRAVENOUS

## 2015-08-16 MED ORDER — BUPIVACAINE-EPINEPHRINE (PF) 0.5% -1:200000 IJ SOLN
INTRAMUSCULAR | Status: AC
  Filled 2015-08-16: qty 30

## 2015-08-16 MED ORDER — PROMETHAZINE HCL 25 MG/ML IJ SOLN
INTRAMUSCULAR | Status: AC
  Administered 2015-08-16: 6.25 mg via INTRAVENOUS
  Filled 2015-08-16: qty 1

## 2015-08-16 MED ORDER — DEXAMETHASONE SODIUM PHOSPHATE 4 MG/ML IJ SOLN
INTRAMUSCULAR | Status: DC | PRN
  Administered 2015-08-16: 8 mg via INTRAVENOUS

## 2015-08-16 MED ORDER — LIDOCAINE HCL (CARDIAC) 20 MG/ML IV SOLN
INTRAVENOUS | Status: DC | PRN
  Administered 2015-08-16: 60 mg via INTRAVENOUS

## 2015-08-16 MED ORDER — ONDANSETRON HCL 4 MG/2ML IJ SOLN
INTRAMUSCULAR | Status: DC | PRN
  Administered 2015-08-16: 4 mg via INTRAVENOUS

## 2015-08-16 MED ORDER — ACETAMINOPHEN 10 MG/ML IV SOLN
INTRAVENOUS | Status: DC | PRN
  Administered 2015-08-16: 1000 mg via INTRAVENOUS

## 2015-08-16 MED ORDER — PROPOFOL 10 MG/ML IV BOLUS
INTRAVENOUS | Status: DC | PRN
  Administered 2015-08-16: 140 mg via INTRAVENOUS

## 2015-08-16 MED ORDER — OXYCODONE HCL 5 MG/5ML PO SOLN: 5.0000 mg | Freq: Once | ORAL | Status: AC | PRN

## 2015-08-16 MED ORDER — NEOSTIGMINE METHYLSULFATE 10 MG/10ML IV SOLN
INTRAVENOUS | Status: DC | PRN
  Administered 2015-08-16: 3 mg via INTRAVENOUS

## 2015-08-16 MED ORDER — KETOROLAC TROMETHAMINE 30 MG/ML IJ SOLN
INTRAMUSCULAR | Status: DC | PRN
  Administered 2015-08-16: 30 mg via INTRAVENOUS

## 2015-08-16 MED ORDER — FAMOTIDINE 20 MG PO TABS
20.0000 mg | ORAL_TABLET | Freq: Once | ORAL | Status: AC
  Administered 2015-08-16: 20 mg via ORAL

## 2015-08-16 MED ORDER — HYDROMORPHONE HCL 1 MG/ML IJ SOLN: 0.2500 mg | INTRAMUSCULAR | Status: DC | PRN

## 2015-08-16 MED ORDER — GLYCOPYRROLATE 0.2 MG/ML IJ SOLN
INTRAMUSCULAR | Status: DC | PRN
  Administered 2015-08-16: 0.4 mg via INTRAVENOUS

## 2015-08-16 MED ORDER — FENTANYL CITRATE (PF) 100 MCG/2ML IJ SOLN
25.0000 ug | INTRAMUSCULAR | Status: DC | PRN
  Administered 2015-08-16 (×4): 25 ug via INTRAVENOUS

## 2015-08-16 SURGICAL SUPPLY — 29 items
BLADE SURG SZ11 CARB STEEL (BLADE) ×3 IMPLANT
BNDG ADH 2 X3.75 FABRIC TAN LF (GAUZE/BANDAGES/DRESSINGS) ×9 IMPLANT
CANISTER SUCT 1200ML W/VALVE (MISCELLANEOUS) ×3 IMPLANT
CATH ROBINSON RED A/P 16FR (CATHETERS) ×3 IMPLANT
CHLORAPREP W/TINT 26ML (MISCELLANEOUS) ×3 IMPLANT
GLOVE BIO SURGEON STRL SZ8 (GLOVE) ×3 IMPLANT
GLOVE INDICATOR 8.0 STRL GRN (GLOVE) ×3 IMPLANT
GOWN STRL REUS W/ TWL LRG LVL3 (GOWN DISPOSABLE) ×1 IMPLANT
GOWN STRL REUS W/ TWL XL LVL3 (GOWN DISPOSABLE) ×1 IMPLANT
GOWN STRL REUS W/TWL LRG LVL3 (GOWN DISPOSABLE) ×2
GOWN STRL REUS W/TWL XL LVL3 (GOWN DISPOSABLE) ×2
IRRIGATION STRYKERFLOW (MISCELLANEOUS) ×1 IMPLANT
IRRIGATOR STRYKERFLOW (MISCELLANEOUS) ×3
IV LACTATED RINGERS 1000ML (IV SOLUTION) ×3 IMPLANT
KIT RM TURNOVER CYSTO AR (KITS) ×3 IMPLANT
LABEL OR SOLS (LABEL) IMPLANT
NS IRRIG 1000ML POUR BTL (IV SOLUTION) ×3 IMPLANT
NS IRRIG 500ML POUR BTL (IV SOLUTION) ×3 IMPLANT
PACK GYN LAPAROSCOPIC (MISCELLANEOUS) ×3 IMPLANT
PAD OB MATERNITY 4.3X12.25 (PERSONAL CARE ITEMS) ×3 IMPLANT
PAD PREP 24X41 OB/GYN DISP (PERSONAL CARE ITEMS) ×3 IMPLANT
POUCH ENDO CATCH 10MM SPEC (MISCELLANEOUS) IMPLANT
SCISSORS METZENBAUM CVD 33 (INSTRUMENTS) IMPLANT
SLEEVE ENDOPATH XCEL 5M (ENDOMECHANICALS) ×3 IMPLANT
SUT PLAIN 4 0 FS 2 27 (SUTURE) ×3 IMPLANT
SUT VIC AB 0 CT2 27 (SUTURE) ×3 IMPLANT
SUT VIC AB 0 UR5 27 (SUTURE) ×3 IMPLANT
TROCAR XCEL NON-BLD 5MMX100MML (ENDOMECHANICALS) ×3 IMPLANT
TUBING INSUFFLATOR HI FLOW (MISCELLANEOUS) ×3 IMPLANT

## 2015-08-16 NOTE — H&P (View-Only) (Signed)
Patient ID: Shannon Ross, female   DOB: 12/17/1970, 44 y.o.   MRN: [id number] Pre-op-lap with bx  Subjective: PREOPERATIVE HISTORY AND PHYSICAL  Chief complaint: 1.  Chronic pelvic . Date of surgery: 08/16/2015   Patient is a 44 y.o. [id number] scheduled for Laparoscopy with peritoneal biopsies. Indications for procedure are Chronic pelvic pain.  Patient is a 44 year old female, para [id number], Using Mirena IUD for contraception 4 years, amenorrheic for 4 years until recent development of abnormal uterine bleeding,Who presents for surgical evaluation. Patient has been experiencing left lower quadrant pain for the past 3 months, which is steady; previously have been occurring off and on for 8 months. Recent ultrasound demonstrated complex right ovarian cyst; normal left adnexa  GYN history: Menarche age 44. Long history of severe dysmenorrhea. Dysmenorrhea eased after first child was born; subsequently returned. Dysmenorrhea is described as Central cramping, With associated sharp exacerbations on the left side that can last for minutes and is associated with nausea at times. Now has constant component of pelvic discomfort that is worse with activity. Pain improves coming off her feet and using Tylenol analgesics. Patient cannot take nonsteroidals. Patient does not report deep thrusting dyspareunia. Patient had previously used birth control pills but stopped them because of her failed conception Producing her first child. Amenorrhea for 4 years with Mirena use;Now New onset Bleeding with associated pain. Family history of endometriosis-Mother  History of recurrent UTIs, 4-6 per year; no urology evaluation  Past Medical History  Diagnosis Date  . Fatigue   . GERD (gastroesophageal reflux disease)   . Cough due to bronchospasm    Past Surgical History  Procedure Laterality Date  . None      Family history: Noncontributory Social history: Review of systems: No recent history of  upper respiratory infection, Antibiotic use, fevers, chills or sweats. No history of coagulopathy. No chest pain, shortness of breath, history of asthma   Objective:BP 123/79 mmHg  Pulse 73  Ht 5\' 3"  (1.6 m)  Wt 187 lb 4.8 oz (84.959 kg)  BMI 33.19 kg/m2  LMP 07/24/2015 Pleasant white female in no acute distress. She is alert and oriented.  HEENT exam: Normocephalic, atraumatic.Oropharynx clear. Neck: Supple without thyromegaly or adenopathy. Lungs: Clear. Heart: Regular rate and rhythm without murmur Back no CVA tenderness. Abdomen: Soft, nontender, without organomegaly. Pelvic exam: External genitalia normal BUS normal. Vagina normal Cervix- no gross lesions; 4/4 Cervical motion tenderness. Uterus- tender, 4/4, normal size and shape, midplane Adnexa-left lower quadrant tenderness 4/4; right lower quadrant, nontender. Rectovaginal-normal sphincter tone,; no masses; no uterosacral nodularity  ASSESSMENT: 1.Chronic pelvic pain, with recent exacerbation, not well controlled with oral analgesics.Symptoms midline and left-sided 2. Suspect endometriosis, refractory to Mirena IUD. 3.New onset abnormal uterine bleeding after 4 years of amenorrhea from Mirena IUD use, likely related to endometriosis 4. Complex right ovarian cyst  PLAN: 1. Laparoscopy with peritoneal biopsies.    Counseling: The patient is to undergo laparoscopy with peritoneal biopsies for evaluation of chronic pelvic pain.  She is understanding of the planned procedure and is aware of and is accepting of all surgical risks which include but are not limited to bleeding, infection, pelvic organ inury with need for repair, both blood disorders, anesthesia risks, etc.  All questions are answered.  Informed consent is given.  Patient is ready and willing to proceed with surgery as scheduled.  Herold HarmsMartin A Mary-Ann Pennella, MD  Note: This dictation was prepared with Dragon dictation along with smaller phrase technology. Any  transcriptional errors that  result from this process are unintentional.

## 2015-08-16 NOTE — Transfer of Care (Signed)
Immediate Anesthesia Transfer of Care Note  Patient: Danise MinaMelanie H Claggett  Procedure(s) Performed: Procedure(s): LAPAROSCOPY OPERATIVE, RIGHT OVARIAN CYST ASPIRATION, FULGERATION/EXCISION OF ENDOMETRIOSIS/BIOPSIES (N/A)  Patient Location: PACU  Anesthesia Type:General  Level of Consciousness: sedated  Airway & Oxygen Therapy: Patient Spontanous Breathing and Patient connected to face mask oxygen  Post-op Assessment: Report given to RN and Post -op Vital signs reviewed and stable  Post vital signs: Reviewed and stable  Last Vitals:  Filed Vitals:   08/16/15 0853  BP: 98/66  Pulse: 95  Temp: 37 C  Resp: 16    Complications: No apparent anesthesia complications

## 2015-08-16 NOTE — Interval H&P Note (Signed)
History and Physical Interval Note:  08/16/2015 8:47 AM  Shannon Ross  has presented today for surgery, with the diagnosis of CHRONIC PELVIC PAIN, RIGHT OVARIAN CYST, SUSPECTED ENDOMETRIOSIS  The various methods of treatment have been discussed with the patient and family. After consideration of risks, benefits and other options for treatment, the patient has consented to  Procedure(s): LAPAROSCOPY OPERATIVE (N/A) as a surgical intervention .  The patient's history has been reviewed, patient examined, no change in status, stable for surgery.  I have reviewed the patient's chart and labs.  Questions were answered to the patient's satisfaction.     Daphine DeutscherMartin A Defrancesco

## 2015-08-16 NOTE — Progress Notes (Signed)
Phenergan given for nausea.

## 2015-08-16 NOTE — Anesthesia Postprocedure Evaluation (Signed)
Anesthesia Post Note  Patient: Shannon Ross  Procedure(s) Performed: Procedure(s) (LRB): LAPAROSCOPY OPERATIVE, RIGHT OVARIAN CYST ASPIRATION, FULGERATION/EXCISION OF ENDOMETRIOSIS/BIOPSIES (N/A)  Patient location during evaluation: PACU Anesthesia Type: General Level of consciousness: awake and alert Pain management: pain level controlled Vital Signs Assessment: post-procedure vital signs reviewed and stable Respiratory status: spontaneous breathing, nonlabored ventilation, respiratory function stable and patient connected to nasal cannula oxygen Cardiovascular status: blood pressure returned to baseline and stable Postop Assessment: No signs of nausea or vomiting Anesthetic complications: no    Last Vitals:  Filed Vitals:   08/16/15 1133 08/16/15 1155  BP:    Pulse: 76 54  Temp:  37.2 C  Resp: 12 10    Last Pain:  Filed Vitals:   08/16/15 1158  PainSc: 3                  Shannon Ross

## 2015-08-16 NOTE — Op Note (Addendum)
Danise MinaMelanie H Vanoverbeke PROCEDURE DATE: 08/16/2015 10:31 AM  PREOPERATIVE DIAGNOSIS: CHRONIC PELVIC PAIN, RIGHT OVARIAN CYST, SUSPECTED ENDOMETRIOSIS POSTOPERATIVE DIAGNOSIS: CHRONIC PELVIC PAIN, RIGHT OVARIAN CYST, SUSPECTED ENDOMETRIOSIS, RIGHT OVARIAN CYST PROCEDURE: Procedure(s): LAPAROSCOPY OPERATIVE, RIGHT OVARIAN CYST ASPIRATION, FULGERATION/EXCISION OF ENDOMETRIOSIS/BIOPSIES (N/A) SURGEON:  Dr. Daphine DeutscherMartin A Tahlia Deamer ASSISTANT: None ANESTHESIA: General Endotracheal  INDICATIONS: 44 y.o. [id number] with history of chronic pelvic pain concerning for endometriosis desiring surgical evaluation.   Please see preoperative notes for further details.   FINDINGS:  Small uterus, normal left ovary and fallopian tube. Normal right ovary. Right ovarian cyst, aspirated of 15 cc of serosanguineous fluid.  white lesions consistent with endometriosis in left and right ovarian fossa respectively: Left ovarian fossa abnormality is biopsied and fulgurated. Cul-de-sac peritoneum demonstrated pseudo-fenestration suspicious for endometriosis; this area was excised and fulgurated. Ureters were noted to have normal peristalsis pre-and postprocedure. I/O's: Total I/O In: 800 [I.V.:800] Out: 50 [Urine:50] SPECIMENS: Peritoneal biopsies : Left ovarian fossa, cul-de-sac COMPLICATIONS: None immediate COUNTS:  YES  PROCEDURE IN DETAIL: The patient was brought to the operating room where she was placed in the supine position.  General endotracheal anesthesia was induced without difficulty.  She was placed in the dorsal lithotomy position using bumblebee stirrups.  A ChloraPrep and Betadine, abdominal, perineal, intravaginal prep and drape was performed in standard fashion.  The timeout was completed.  The red Robison catheter was used to drain the bladder of clear urine.  A weighted speculum was placed into the vagina and a Hulka tenaculum was placed onto the cervix to facilitate uterine manipulation. Laparoscopy was performed in  standard fashion.  The Optiview 5 mm trocar and sleeve were placed through a 5 mm subumbilical incision directly into the abdominal pelvic cavity, without evidence of bowel or vascular injury. One Additional 5 mm port was placed in the lower quadrant, respectively, under direct visualization.  The above noted findings were photo documented. The cul-de-sac pseudo-fenestration was excised with biopsy forceps; Kleppinger bipolar forceps were used to cauterize the area to treat any potential adjacent endometriosis. The left ovarian fossa White lesion was excised with biopsy forceps and the area was cauterized. Care was taken to avoid the ureter. The right ovarian cyst was isolated and aspirated of 15 cc of serosanguineous fluid using an aspiration needle. Inspection of the bladder revealed no abnormal lesions. Inspection of the upper abdomen was normal. Following complete survey of the abdominal pelvic cavity, the pelvis was irrigated with normal saline and the irrigant fluid was aspirated to remove blood and cyst fluid. Upon completion of the procedures and inspection for hemostasis, the surgery was complete with all instrumentation being removed from the abdominal pelvic cavity.  The pneumoperitoneum was released.  The incisions were closed using 40 plain. Telfa and OpSite dressings were placed..  The patient was awakened, extubated, and taken to the recovery room in satisfactory condition.  Herold HarmsMartin A Kolbi Tofte, MD [facility]   ADDENDUM: Should patient require hysterectomy, LAVH or total vaginal  hysterectomy would be the desired route.

## 2015-08-16 NOTE — Progress Notes (Signed)
Nausea better pain level 3 out of 10 when does not move

## 2015-08-16 NOTE — Discharge Instructions (Signed)

## 2015-08-16 NOTE — Anesthesia Preprocedure Evaluation (Signed)
Anesthesia Evaluation  Patient identified by MRN, date of birth, ID band Patient awake    Reviewed: Allergy & Precautions, H&P , NPO status , Patient's Chart, lab work & pertinent test results  History of Anesthesia Complications (+) PONV and history of anesthetic complications  Airway Mallampati: II  TM Distance: >3 FB Neck ROM: full    Dental  (+) Poor Dentition, Chipped   Pulmonary neg pulmonary ROS, neg shortness of breath,    Pulmonary exam normal breath sounds clear to auscultation       Cardiovascular Exercise Tolerance: Good (-) angina(-) Past MI and (-) DOE negative cardio ROS Normal cardiovascular exam Rhythm:regular Rate:Normal     Neuro/Psych negative neurological ROS  negative psych ROS   GI/Hepatic Neg liver ROS, GERD  Controlled,  Endo/Other  negative endocrine ROS  Renal/GU negative Renal ROS  negative genitourinary   Musculoskeletal   Abdominal   Peds  Hematology negative hematology ROS (+)   Anesthesia Other Findings Past Medical History:   Fatigue                                                      GERD (gastroesophageal reflux disease)                       Cough due to bronchospasm                                    Complication of anesthesia                                   PONV (postoperative nausea and vomiting)                    Past Surgical History:   none                                                          KNEE ARTHROSCOPY                                Left              BUNIONECTOMY                                                    Reproductive/Obstetrics negative OB ROS                             Anesthesia Physical Anesthesia Plan  ASA: III  Anesthesia Plan: General ETT   Post-op Pain Management:    Induction:   Airway Management Planned:   Additional Equipment:   Intra-op Plan:   Post-operative Plan:   Informed Consent: I  have reviewed the patients History and Physical, chart, labs  and discussed the procedure including the risks, benefits and alternatives for the proposed anesthesia with the patient or authorized representative who has indicated his/her understanding and acceptance.   Dental Advisory Given  Plan Discussed with: Anesthesiologist, CRNA and Surgeon  Anesthesia Plan Comments:         Anesthesia Quick Evaluation

## 2015-08-17 ENCOUNTER — Telehealth: Payer: Self-pay | Admitting: Obstetrics and Gynecology

## 2015-08-17 LAB — SURGICAL PATHOLOGY

## 2015-08-17 NOTE — Telephone Encounter (Signed)
Pt is s/p 1 day lap w/bx. She states she is very sore. Taking percocet 1 q4. Bowel and bladder are working normal. Pos eating and drinking. NO fevers. NO oozing noted at incision sites. Slight blood on bandage. Pt advised to leave bandage on unitl post op visit. Shower and pat dry. Pt advised to contact office if fevers, oozing from incision, or pain not relieved with percocet. Pt voices understanding.

## 2015-08-17 NOTE — Telephone Encounter (Signed)
Patient called with questions regarding post op care for her incisions. She wasn't sure if it is ok to shower or if she needs to leave the steri strips on until her post op visit. She would like a call back. Thanks

## 2015-08-24 ENCOUNTER — Ambulatory Visit (INDEPENDENT_AMBULATORY_CARE_PROVIDER_SITE_OTHER): Payer: BC Managed Care – PPO | Admitting: Obstetrics and Gynecology

## 2015-08-24 ENCOUNTER — Encounter: Payer: Self-pay | Admitting: Obstetrics and Gynecology

## 2015-08-24 VITALS — BP 120/77 | HR 65 | Ht 63.0 in | Wt 185.3 lb

## 2015-08-24 DIAGNOSIS — Z8742 Personal history of other diseases of the female genital tract: Secondary | ICD-10-CM

## 2015-08-24 DIAGNOSIS — Z842 Family history of other diseases of the genitourinary system: Secondary | ICD-10-CM

## 2015-08-24 DIAGNOSIS — R102 Pelvic and perineal pain: Secondary | ICD-10-CM

## 2015-08-24 DIAGNOSIS — R300 Dysuria: Secondary | ICD-10-CM

## 2015-08-24 DIAGNOSIS — N390 Urinary tract infection, site not specified: Secondary | ICD-10-CM

## 2015-08-24 DIAGNOSIS — Z8489 Family history of other specified conditions: Secondary | ICD-10-CM

## 2015-08-24 DIAGNOSIS — Z09 Encounter for follow-up examination after completed treatment for conditions other than malignant neoplasm: Secondary | ICD-10-CM

## 2015-08-24 DIAGNOSIS — N83201 Unspecified ovarian cyst, right side: Secondary | ICD-10-CM

## 2015-08-24 LAB — POCT URINALYSIS DIPSTICK
BILIRUBIN UA: NEGATIVE
GLUCOSE UA: NEGATIVE
Ketones, UA: NEGATIVE
NITRITE UA: POSITIVE
PH UA: 6
Spec Grav, UA: 1.025
Urobilinogen, UA: 0.2

## 2015-08-24 MED ORDER — NITROFURANTOIN MONOHYD MACRO 100 MG PO CAPS: 100.0000 mg | ORAL_CAPSULE | Freq: Two times a day (BID) | ORAL | Status: DC

## 2015-08-24 NOTE — Patient Instructions (Signed)
1.  Macrobid twice a day for 7 days for UTI. 2.  Monitor symptoms and return in 3 months for follow-up. 3.  Resume all activities without restriction.

## 2015-08-24 NOTE — Progress Notes (Signed)
Patient ID: Shannon Ross, female   DOB: 07/14/1971, 44 y.o.   MRN: [id number] 1 week post op  lap w.bx  taking ultram Still bleeding  Pelvic pain- same Bowel/bladder- wnl  Chief complaint: 1.  One week postop check. 2.  Status post laparoscopy with biopsies and excision/fulguration of endometriosis, right ovarian cyst aspiration.  Patient is here for her one-week postop check.  L and bladder function are normal.  She is still having some irregular menstrual bleeding post surgery.  Patient has moderate pelvic discomfort for which she is taking Ultram.  Pathology from surgery demonstrated no evidence of endometriosis.  Past medical history, past surgical history, problem list, medications, and allergies are reviewed.  OBJECTIVE: BP 120/77 mmHg  Pulse 65  Ht 5\' 3"  (1.6 m)  Wt 185 lb 4.8 oz (84.052 kg)  BMI 32.83 kg/m2  LMP 07/24/2015 Pleasant, well-appearing female in no acute distress. Abdomen: Soft, nontender, without organomegaly; laparoscopy incisions are well approximated.  No hernias.  ASSESSMENT: 1.  One week postop check status post laparoscopy with right ovarian cyst aspiration, and peritoneal biopsies. 2.  Chronic pelvic pain, unclear etiology.  Pain is primarily left-sided and central without pathologic findings noted at the time of surgery.  Cannot rule out GI or GU etiology.  PLAN: 1.  Resume activities as tolerated. 2.  Monitor symptomatology over the next 3 months. 3.  Follow-up in 3 months for reassessment. 4.  Consider Depo-Lupron trial for possibility of an identified endometriosis causing her pelvic pain. 5.  Consider GI and GU workup of her pelvic pain.  Herold HarmsMartin A Defrancesco, MD  Note: This dictation was prepared with Dragon dictation along with smaller phrase technology. Any transcriptional errors that result from this process are unintentional.

## 2015-08-26 LAB — URINE CULTURE

## 2015-09-01 ENCOUNTER — Ambulatory Visit: Payer: BC Managed Care – PPO | Admitting: Obstetrics and Gynecology

## 2015-09-02 ENCOUNTER — Ambulatory Visit (INDEPENDENT_AMBULATORY_CARE_PROVIDER_SITE_OTHER): Payer: BC Managed Care – PPO | Admitting: Obstetrics and Gynecology

## 2015-09-02 DIAGNOSIS — N390 Urinary tract infection, site not specified: Secondary | ICD-10-CM | POA: Diagnosis not present

## 2015-09-03 LAB — POCT URINALYSIS DIPSTICK
BILIRUBIN UA: NEGATIVE
GLUCOSE UA: NEGATIVE
KETONES UA: NEGATIVE
LEUKOCYTES UA: NEGATIVE
NITRITE UA: NEGATIVE
Protein, UA: NEGATIVE
Spec Grav, UA: 1.02
Urobilinogen, UA: NEGATIVE
pH, UA: 6

## 2015-09-03 NOTE — Progress Notes (Signed)
Pt states that she was told to drop a urine sample off 2 days after stopping antibiotic. I see no documentation of this. However as pt is here I did obtain urine specimen and sent it for evaluation. Will follow up with results when they return.

## 2015-09-05 LAB — URINE CULTURE

## 2015-09-06 ENCOUNTER — Ambulatory Visit (INDEPENDENT_AMBULATORY_CARE_PROVIDER_SITE_OTHER): Payer: BC Managed Care – PPO | Admitting: Obstetrics and Gynecology

## 2015-09-06 ENCOUNTER — Encounter: Payer: Self-pay | Admitting: Obstetrics and Gynecology

## 2015-09-06 VITALS — BP 139/85 | HR 74 | Ht 63.0 in | Wt 185.0 lb

## 2015-09-06 DIAGNOSIS — N390 Urinary tract infection, site not specified: Secondary | ICD-10-CM

## 2015-09-06 DIAGNOSIS — Z87442 Personal history of urinary calculi: Secondary | ICD-10-CM | POA: Diagnosis not present

## 2015-09-06 DIAGNOSIS — R300 Dysuria: Secondary | ICD-10-CM

## 2015-09-06 DIAGNOSIS — R1030 Lower abdominal pain, unspecified: Secondary | ICD-10-CM

## 2015-09-06 LAB — URINALYSIS, COMPLETE
Bilirubin, UA: NEGATIVE
GLUCOSE, UA: NEGATIVE
KETONES UA: NEGATIVE
NITRITE UA: NEGATIVE
Protein, UA: NEGATIVE
SPEC GRAV UA: 1.025 (ref 1.005–1.030)
UUROB: 0.2 mg/dL (ref 0.2–1.0)
pH, UA: 5.5 (ref 5.0–7.5)

## 2015-09-06 LAB — MICROSCOPIC EXAMINATION

## 2015-09-06 LAB — BLADDER SCAN AMB NON-IMAGING: SCAN RESULT: 32

## 2015-09-06 NOTE — Progress Notes (Signed)
09/06/2015 1:32 PM   Shannon Ross [birth year] [id number]  Referring provider: No referring provider defined for this encounter.  Chief Complaint  Patient presents with  . Recurrent UTI    referred by Dr. Algis Downs and Doroteo Glassman    HPI: Patient is a 44 year old female with a remote history of kidney stones presenting today as a referral from her GYN provider for chronic pelvic pain for the last 3 months and recurrent urinary tract infections. She recently underwent exploratory laparoscopy with peritoneal biopsies by Dr Greggory Keen for evaluation of possible endometriosis as a source of her pelvic pain.  A right ovarian cyst was also drained at the time of surgery.  Surgical pathology was found to be negative for endometriosis. Patient reports her pain has not improved since surgery.  1. Pelvic Pain-  Patient reports suprapubic pain left side greater than right over the last 3 months. She reports it waxes and wanes in severity. She describes it as a severe cramping /contraction like sensation. Symptoms worsened when she has been standing for long periods of time at work. She has been taking Ultram as needed for pain control. She does reports some associated urinary frequency with small-volume voids as well as urgency. She states that prolonging voiding after she feels the urge to urinate worsens her pain. She reports that her abdomen is very tender to palpation she cannot even wear a snug fitting pants due to discomfort. No changes in bowel function. Patient denies constipation or diarrhea.  2. Recurrent UTI-   Two urine cultures positive for >100.,000 scherichia coli over the last 3 months. She does report dysuria associated with pelvic pain flareups. Patient is a Runner, broadcasting/film/video and states that she typically will limits her fluid intake. She usually only drinks approximately 20 ounces of water daily.  3.  History of renal stones - Patient reports 1 episode of renal stones with her last pregnancy  approximately 12 years ago. No issues since.    PMH: Past Medical History  Diagnosis Date  . Fatigue   . GERD (gastroesophageal reflux disease)   . Cough due to bronchospasm   . Complication of anesthesia   . PONV (postoperative nausea and vomiting)   . Recurrent UTI   . Abdominal pain, left lower quadrant   . Heartburn     Surgical History: Past Surgical History  Procedure Laterality Date  . Knee arthroscopy Left   . Bunionectomy    . Laparoscopy N/A 08/16/2015    Procedure: LAPAROSCOPY OPERATIVE, RIGHT OVARIAN CYST ASPIRATION, FULGERATION/EXCISION OF ENDOMETRIOSIS/BIOPSIES;  Surgeon: Herold Harms, MD;  Location: [facility] ORS;  Service: Gynecology;  Laterality: N/A;    Home Medications:    Medication List       This list is accurate as of: 09/06/15  1:32 PM.  Always use your most recent med list.               aspirin EC 81 MG tablet  Take 1 tablet (81 mg total) by mouth daily. Take after 12 weeks for prevention of preeclampssia later in pregnancy     cetirizine 10 MG tablet  Commonly known as:  ZYRTEC  Take by mouth. Reported on 09/06/2015     cyanocobalamin 1000 MCG/ML injection  Commonly known as:  (VITAMIN B-12)  Inject 1 mL (1,000 mcg total) into the muscle every 30 (thirty) days.     diclofenac 50 MG tablet  Commonly known as:  CATAFLAM  Take 1 tablet (50 mg total) by mouth 3 (three)  times daily.     levonorgestrel 20 MCG/24HR IUD  Commonly known as:  MIRENA  1 each by Intrauterine route once.     nitrofurantoin (macrocrystal-monohydrate) 100 MG capsule  Commonly known as:  MACROBID  Take 1 capsule (100 mg total) by mouth 2 (two) times daily.     omeprazole 40 MG capsule  Commonly known as:  PRILOSEC  Take 40 mg by mouth daily.     traMADol 50 MG tablet  Commonly known as:  ULTRAM  Take 1 tablet (50 mg total) by mouth 2 (two) times daily.     Vitamin D (Ergocalciferol) 50000 UNITS Caps capsule  Commonly known as:  DRISDOL  TAKE 1 CAPSULE  BY MOUTH TWICE A WEEK        Allergies:  Allergies  Allergen Reactions  . Sulfa Antibiotics Swelling    Tongue swelling     Family History: Family History  Problem Relation Age of Onset  . Diabetes Father   . Aneurysm Mother   . Kidney disease Neg Hx   . Bladder Cancer Neg Hx     Social History:  reports that she has never smoked. She has never used smokeless tobacco. She reports that she does not drink alcohol or use illicit drugs.  ROS: UROLOGY Frequent Urination?: No Hard to postpone urination?: Yes Burning/pain with urination?: Yes Get up at night to urinate?: Yes Leakage of urine?: Yes Urine stream starts and stops?: Yes Trouble starting stream?: No Do you have to strain to urinate?: No Blood in urine?: No Urinary tract infection?: Yes Sexually transmitted disease?: No Injury to kidneys or bladder?: No Painful intercourse?: No Weak stream?: No Currently pregnant?: No Vaginal bleeding?: No Last menstrual period?: n  Gastrointestinal Nausea?: Yes Vomiting?: No Indigestion/heartburn?: Yes Diarrhea?: No Constipation?: No  Constitutional Fever: No Night sweats?: No Weight loss?: No Fatigue?: No  Skin Skin rash/lesions?: No Itching?: No  Eyes Blurred vision?: No Double vision?: No  Ears/Nose/Throat Sore throat?: No Sinus problems?: No  Hematologic/Lymphatic Swollen glands?: No Easy bruising?: No  Cardiovascular Leg swelling?: Yes Chest pain?: No  Respiratory Cough?: No Shortness of breath?: No  Endocrine Excessive thirst?: No  Musculoskeletal Back pain?: No Joint pain?: No  Neurological Headaches?: No Dizziness?: No  Psychologic Depression?: No Anxiety?: No  Physical Exam: BP 139/85 mmHg  Pulse 74  Ht  (1.6 m)  Wt 185 lb (83.915 kg)  BMI 32.78 kg/m2  Constitutional:  Alert and oriented, No acute distress. HEENT: [place] AT, moist mucus membranes.  Trachea midline, no masses. Cardiovascular: No clubbing, cyanosis, or  edema. Respiratory: Normal respiratory effort, no increased work of breathing. GI: Abdomen is soft,  Generalized lower abdominal/suprapubic tenderness, nondistended, no abdominal masses GU: No CVA tenderness.  Skin: No rashes, bruises or suspicious lesions. Lymph: No cervical or inguinal adenopathy. Neurologic: Grossly intact, no focal deficits, moving all 4 extremities. Psychiatric: Normal mood and affect.  Laboratory Data:   Urinalysis Results for orders placed or performed in visit on 09/06/15  Microscopic Examination  Result Value Ref Range   WBC, UA 0-5 0 -  5 /hpf   RBC, UA 0-2 0 -  2 /hpf   Epithelial Cells (non renal) 0-10 0 - 10 /hpf   Mucus, UA Present (A) Not Estab.   Bacteria, UA Many (A) None seen/Few  Urinalysis, Complete  Result Value Ref Range   Specific Gravity, UA 1.025 1.005 - 1.030   pH, UA 5.5 5.0 - 7.5   Color, UA Yellow Yellow  Appearance Ur Clear Clear   Leukocytes, UA Trace (A) Negative   Protein, UA Negative Negative/Trace   Glucose, UA Negative Negative   Ketones, UA Negative Negative   RBC, UA 1+ (A) Negative   Bilirubin, UA Negative Negative   Urobilinogen, Ur 0.2 0.2 - 1.0 mg/dL   Nitrite, UA Negative Negative   Microscopic Examination See below:   BLADDER SCAN AMB NON-IMAGING  Result Value Ref Range   Scan Result 32     Pertinent Imaging:  Assessment & Plan:    1. Recurrent UTI- UTI prevention strategies discussed.  Good perineal hygiene reviewed. Patient is encouraged to increase daily water intake, start cranberry supplements to prevent invasive colonization along the urinary tract and probiotics, especially lactobacillus to restore normal vaginal flora. - Urinalysis, Complete - BLADDER SCAN AMB NON-IMAGING  2. Suprapubic discomfort-  Previous workup for possible endometriosis negative by patient's GYN provider Dr. Greggory Keenefrancesco. Differential includes interstitial cystitis vs pelvic floor dysfunction vs obstructing renal stones.  We  will perform a CT renal stone study for further evaluation. Patient was also provided information on interstitial cystitis. We discussed possible PT evaluation in the future for treatment of suspected pelvic floor dysfunction. -CT Renal Stone Study  3. Dysuria-  UA unremarkable today. Dysuria most likely caused by previous urinary tract infections versus interstitial cystitis flares. Urine sent for culture.  4. H/o of stones-   Return for CT resutls.  These notes generated with voice recognition software. I apologize for typographical errors.  Earlie LouLindsay Topacio Cella, FNP  [facility] [address], [address] [place], [place] [id number] [phone]

## 2015-09-06 NOTE — Patient Instructions (Signed)
Urinary Tract Infection Prevention Patient Education Stay Hydrated: Urinary tract infections (UTIs) are less likely to occur in someone who is drinking enough water to promote regular urination, so it is very important to stay hydrated in order to help flush out bacteria from the urinary tract. Respond to "Nature's Call": It is always a good idea to urinate as soon as you feel the need. While "holding it in" does not directly cause an infection, it can cause overdistension that can damage the lining of the bladder, making it more vulnerable to bacteria. Remove Tampons Before Going: Remember to always take out tampons before urinating, and change tampons often.  Practice Proper Bathroom Hygiene: To keep bacteria near the urethral opening to a minimum, it is important to practice proper wiping techniques (i.e. front to back wiping) to help prevent rectal bacteria from entering the uretro-genital area. It can also be helpful to take showers and avoid soaking in the bathtub.  Take a Vitamin C Supplement: About 1,000 milligrams of vitamin C taken daily can help inhibit the growth of some bacteria by acidifying the urine. Maintain Control with Cranberries: Cranberries contain hippuronic acid, which is a natural antiseptic that may help prevent the adherence of bacteria to the bladder lining. Drinking 100% pure cranberry juice or taking over the counter cranberry supplements twice daily may help to prevent an infection. However, it is important to note that cranberry juices/supplements are not helpful once a urinary tract infection (UTI) is present. Strengthen Your Core: Often, a lazy bladder (unable to empty urine properly) occurs due to lower back problem, so consider doing exercises to help strengthen your back, pelvic floor, and stomach muscles.  Pay Attention to Your Urine: Your urine can change color for a variety of reasons, including from the medications you  take, so pay close attention to it to monitor your overall health. One key thing to note is that if your urine is typically a darker yellow, your body is dehydrated, so you need to step up your water intake.    Interstitial Cystitis Interstitial cystitis is a condition that causes inflammation of the bladder. The bladder is a hollow organ in the lower part of your abdomen. It stores urine after the urine is made by your kidneys. With interstitial cystitis, you may have pain in the bladder area. You may also have a frequent and urgent need to urinate. The severity of interstitial cystitis can vary from person to person. You may have flare-ups of the condition, and then it may go away for a while. For many people who have this condition, it becomes a long-term problem. CAUSES The cause of this condition is not known. RISK FACTORS This condition is more likely to develop in women. SYMPTOMS Symptoms of interstitial cystitis vary, and they can change over time. Symptoms may include:  Discomfort or pain in the bladder area. This can range from mild to severe. The pain may change in intensity as the bladder fills with urine or as it empties.  Pelvic pain.  An urgent need to urinate.  Frequent urination.  Pain during sexual intercourse.  Pinpoint bleeding on the bladder wall. For women, the symptoms often get worse during menstruation. DIAGNOSIS This condition is diagnosed by evaluating your symptoms and  ruling out other causes. A physical exam will be done. Various tests may be done to rule out other conditions. Common tests include:  Urine tests.  Cystoscopy. In this test, a tool that is like a very thin telescope is used to look into your bladder.  Biopsy. This involves taking a sample of tissue from the bladder wall to be examined under a microscope. TREATMENT There is no cure for interstitial cystitis, but treatment methods are available to control your symptoms. Work closely with your  health care provider to find the treatments that will be most effective for you. Treatment options may include:  Medicines to relieve pain and to help reduce the number of times that you feel the need to urinate.  Bladder training. This involves learning ways to control when you urinate, such as:  Urinating at scheduled times.  Training yourself to delay urination.  Doing exercises (Kegel exercises) to strengthen the muscles that control urine flow.  Lifestyle changes, such as changing your diet or taking steps to control stress.  Use of a device that provides electrical stimulation in order to reduce pain.  A procedure that stretches your bladder by filling it with air or fluid.  Surgery. This is rare. It is only done for extreme cases if other treatments do not help. HOME CARE INSTRUCTIONS  Take medicines only as directed by your health care provider.  Use bladder training techniques as directed.  Keep a bladder diary to find out which foods, liquids, or activities make your symptoms worse.  Use your bladder diary to schedule bathroom trips. If you are away from home, plan to be near a bathroom at each of your scheduled times.  Make sure you urinate just before you leave the house and just before you go to bed.  Do Kegel exercises as directed by your health care provider.  Do not drink alcohol.  Do not use any tobacco products, including cigarettes, chewing tobacco, or electronic cigarettes. If you need help quitting, ask your health care provider.  Make dietary changes as directed by your health care provider. You may need to avoid spicy foods and foods that contain a high amount of potassium.  Limit your drinking of beverages that stimulate urination. These include soda, coffee, and tea.  Keep all follow-up visits as directed by your health care provider. This is important. SEEK MEDICAL CARE IF:  Your symptoms do not get better after treatment.  Your pain and  discomfort are getting worse.  You have more frequent urges to urinate.  You have a fever. SEEK IMMEDIATE MEDICAL CARE IF:  You are not able to control your bladder at all.   This information is not intended to replace advice given to you by your health care provider. Make sure you discuss any questions you have with your health care provider.   Document Released: 05/05/2004 Document Revised: 09/25/2014 Document Reviewed: 05/12/2014 Elsevier Interactive Patient Education [employer].

## 2015-09-08 ENCOUNTER — Telehealth: Payer: Self-pay

## 2015-09-08 LAB — CULTURE, URINE COMPREHENSIVE

## 2015-09-08 NOTE — Telephone Encounter (Signed)
LMOM- labs negative.  

## 2015-09-08 NOTE — Telephone Encounter (Signed)
-----   Message from Fernanda DrumLindsay C Overton, FNP sent at 09/08/2015  2:43 PM EST ----- Please notify patient that her urine culture was completely negative for infection. Thank you

## 2015-09-17 ENCOUNTER — Ambulatory Visit
Admission: RE | Admit: 2015-09-17 | Discharge: 2015-09-17 | Disposition: A | Discharge status: Home / Self Care | Payer: BC Managed Care – PPO | Source: Ambulatory Visit | Attending: Obstetrics and Gynecology | Admitting: Obstetrics and Gynecology

## 2015-09-17 DIAGNOSIS — N390 Urinary tract infection, site not specified: Secondary | ICD-10-CM | POA: Insufficient documentation

## 2015-09-22 ENCOUNTER — Ambulatory Visit (INDEPENDENT_AMBULATORY_CARE_PROVIDER_SITE_OTHER): Payer: BC Managed Care – PPO | Admitting: Obstetrics and Gynecology

## 2015-09-22 ENCOUNTER — Encounter: Payer: Self-pay | Admitting: Obstetrics and Gynecology

## 2015-09-22 VITALS — BP 113/73 | HR 53 | Temp 98.0°F | Resp 16 | Ht 63.0 in | Wt 185.0 lb

## 2015-09-22 DIAGNOSIS — N390 Urinary tract infection, site not specified: Secondary | ICD-10-CM

## 2015-09-22 DIAGNOSIS — R300 Dysuria: Secondary | ICD-10-CM

## 2015-09-22 DIAGNOSIS — R102 Pelvic and perineal pain: Secondary | ICD-10-CM | POA: Diagnosis not present

## 2015-09-22 LAB — MICROSCOPIC EXAMINATION: Epithelial Cells (non renal): >10 /hpf — ABNORMAL HIGH (ref 0–10)

## 2015-09-22 LAB — URINALYSIS, COMPLETE
BILIRUBIN UA: NEGATIVE
GLUCOSE, UA: NEGATIVE
LEUKOCYTES UA: NEGATIVE
NITRITE UA: NEGATIVE
Protein, UA: NEGATIVE
Urobilinogen, Ur: 0.2 mg/dL (ref 0.2–1.0)
pH, UA: 5.5 (ref 5.0–7.5)

## 2015-09-22 NOTE — Patient Instructions (Signed)
Uribel- 1 tab 4 times daily as needed for pain with a full glass of water

## 2015-09-22 NOTE — Progress Notes (Signed)
12:36 PM   Danise MinaMelanie H Edmunds 02/01/1971 [id number]  Referring provider: No referring provider defined for this encounter.  Chief Complaint  Patient presents with  . Follow-up    HPI: Patient is a 45 year old female with a remote history of kidney stones presenting today as a referral from her GYN provider for chronic pelvic pain for the last 3 months and recurrent urinary tract infections. She recently underwent exploratory laparoscopy with peritoneal biopsies by Dr Greggory Keenefrancesco for evaluation of possible endometriosis as a source of her pelvic pain.  A right ovarian cyst was also drained at the time of surgery.  Surgical pathology was found to be negative for endometriosis. Patient reports her pain has not improved since surgery.  1. Pelvic Pain-  Patient reports suprapubic pain left side greater than right over the last 3 months. She reports it waxes and wanes in severity. She describes it as a severe cramping /contraction like sensation. Symptoms worsened when she has been standing for long periods of time at work. She has been taking Ultram as needed for pain control. She does reports some associated urinary frequency with small-volume voids as well as urgency. She states that prolonging voiding after she feels the urge to urinate worsens her pain. She reports that her abdomen is very tender to palpation she cannot even wear a snug fitting pants due to discomfort. No changes in bowel function. Patient denies constipation or diarrhea.  2. Recurrent UTI-   Two urine cultures positive for >100.,000 scherichia coli over the last 3 months. She does report dysuria associated with pelvic pain flareups. Patient is a Runner, broadcasting/film/videoteacher and states that she typically will limits her fluid intake. She usually only drinks approximately 20 ounces of water daily.  3.  History of renal stones - Patient reports 1 episode of renal stones with her last pregnancy approximately 12 years ago. No issues since.   Current  Status: Patient presents today to review CT results. She reports that her symptoms are unchanged.   PMH: Past Medical History  Diagnosis Date  . Fatigue   . GERD (gastroesophageal reflux disease)   . Cough due to bronchospasm   . Complication of anesthesia   . PONV (postoperative nausea and vomiting)   . Recurrent UTI   . Abdominal pain, left lower quadrant   . Heartburn     Surgical History: Past Surgical History  Procedure Laterality Date  . Knee arthroscopy Left   . Bunionectomy    . Laparoscopy N/A 08/16/2015    Procedure: LAPAROSCOPY OPERATIVE, RIGHT OVARIAN CYST ASPIRATION, FULGERATION/EXCISION OF ENDOMETRIOSIS/BIOPSIES;  Surgeon: Herold HarmsMartin A Defrancesco, MD;  Location: [facility] ORS;  Service: Gynecology;  Laterality: N/A;    Home Medications:    Medication List       This list is accurate as of: 09/22/15 11:59 PM.  Always use your most recent med list.               aspirin EC 81 MG tablet  Take 1 tablet (81 mg total) by mouth daily. Take after 12 weeks for prevention of preeclampssia later in pregnancy     cetirizine 10 MG tablet  Commonly known as:  ZYRTEC  Take by mouth. Reported on 09/06/2015     cyanocobalamin 1000 MCG/ML injection  Commonly known as:  (VITAMIN B-12)  Inject 1 mL (1,000 mcg total) into the muscle every 30 (thirty) days.     diclofenac 50 MG tablet  Commonly known as:  CATAFLAM  Take 1 tablet (50 mg total)  by mouth 3 (three) times daily.     levonorgestrel 20 MCG/24HR IUD  Commonly known as:  MIRENA  1 each by Intrauterine route once.     nitrofurantoin (macrocrystal-monohydrate) 100 MG capsule  Commonly known as:  MACROBID  Take 1 capsule (100 mg total) by mouth 2 (two) times daily.     omeprazole 40 MG capsule  Commonly known as:  PRILOSEC  Take 40 mg by mouth daily.     traMADol 50 MG tablet  Commonly known as:  ULTRAM  Take 1 tablet (50 mg total) by mouth 2 (two) times daily.     Vitamin D (Ergocalciferol) 50000 units Caps  capsule  Commonly known as:  DRISDOL  TAKE 1 CAPSULE BY MOUTH TWICE A WEEK        Allergies:  Allergies  Allergen Reactions  . Sulfa Antibiotics Swelling    Tongue swelling     Family History: Family History  Problem Relation Age of Onset  . Diabetes Father   . Aneurysm Mother   . Kidney disease Neg Hx   . Bladder Cancer Neg Hx     Social History:  reports that she has never smoked. She has never used smokeless tobacco. She reports that she does not drink alcohol or use illicit drugs.  ROS:                                        Physical Exam: BP 113/73 mmHg  Pulse 53  Temp(Src) 98 F (36.7 C)  Resp 16  Ht 5\' 3"  (1.6 m)  Wt 185 lb (83.915 kg)  BMI 32.78 kg/m2  Constitutional:  Alert and oriented, No acute distress. HEENT: [place] AT, moist mucus membranes.  Trachea midline, no masses. Cardiovascular: No clubbing, cyanosis, or edema. Respiratory: Normal respiratory effort, no increased work of breathing. GI: Abdomen is soft,  Generalized lower abdominal/suprapubic tenderness, nondistended, no abdominal masses GU: No CVA tenderness.  Skin: No rashes, bruises or suspicious lesions. Lymph: No cervical or inguinal adenopathy. Neurologic: Grossly intact, no focal deficits, moving all 4 extremities. Psychiatric: Normal mood and affect.  Laboratory Data:   Urinalysis Results for orders placed or performed in visit on 09/22/15  Microscopic Examination  Result Value Ref Range   WBC, UA 0-5 0 -  5 /hpf   RBC, UA 0-2 0 -  2 /hpf   Epithelial Cells (non renal) >10 (H) 0 - 10 /hpf   Mucus, UA Present (A) Not Estab.   Bacteria, UA Many (A) None seen/Few  Urinalysis, Complete  Result Value Ref Range   Specific Gravity, UA >1.030 (H) 1.005 - 1.030   pH, UA 5.5 5.0 - 7.5   Color, UA Yellow Yellow   Appearance Ur Cloudy (A) Clear   Leukocytes, UA Negative Negative   Protein, UA Negative Negative/Trace   Glucose, UA Negative Negative   Ketones, UA  Trace (A) Negative   RBC, UA Trace (A) Negative   Bilirubin, UA Negative Negative   Urobilinogen, Ur 0.2 0.2 - 1.0 mg/dL   Nitrite, UA Negative Negative   Microscopic Examination See below:     Pertinent Imaging: CLINICAL DATA: Microscopic hematuria, dysuria, left pelvic pain x4 months, recurrent UTIs  EXAM: CT ABDOMEN AND PELVIS WITHOUT CONTRAST  TECHNIQUE: Multidetector CT imaging of the abdomen and pelvis was performed following the standard protocol without IV contrast.  COMPARISON: None.  FINDINGS: Lower chest: Lung  bases are clear.  Hepatobiliary: Unenhanced liver is unremarkable.  Gallbladder is unremarkable. No intrahepatic or extrahepatic ductal dilatation.  Pancreas: Within normal limits.  Spleen: Within normal limits.  Adrenals/Urinary Tract: Adrenal glands within normal limits.  Kidneys within normal limits. No renal, ureteral, or bladder calculi. No hydronephrosis.  Bladder is underdistended.  Stomach/Bowel: Stomach is within normal limits.  No evidence of bowel obstruction.  Normal appendix (series 5/image 73).  Vascular/Lymphatic: No evidence of abdominal aortic aneurysm.  No suspicious abdominopelvic lymphadenopathy.  Reproductive: Uterus is notable for a T-shaped IUD in satisfactory position.  Bilateral ovaries are within normal limits.  Other: No abdominopelvic ascites.  Musculoskeletal: Visualized osseous structures are within normal limits.  IMPRESSION: No renal, ureteral, or bladder calculi. No hydronephrosis.  Negative CT abdomen/pelvis.   Electronically Signed  By: Charline Bills M.D.  On: 09/17/2015 12:39  Assessment & Plan:    1. Recurrent UTI- UTI prevention strategies discussed.  Good perineal hygiene reviewed. Patient is encouraged to increase daily water intake, start cranberry supplements to prevent invasive colonization along the urinary tract and probiotics, especially  lactobacillus to restore normal vaginal flora. -Still only drinking one bottle of water daily encouraged increased fluid intake - Urinalysis, Complete - BLADDER SCAN AMB NON-IMAGING  2. Suprapubic discomfort-  Previous workup for possible endometriosis negative by patient's GYN provider Dr. Greggory Keen. Differential includes interstitial cystitis vs pelvic floor dysfunction vs obstructing renal stones.  Did notice flare up after drinking 2 glasses of tea and eating peanuts/corn. CT negative for urinary tract stones or other GU pathology. She does report that her left lower quadrant pain is greater than her right. Possible diverticulitis? Will refer to GI for further workup. -Referral to GI  3. Dysuria-  UA unremarkable today. Dysuria most likely caused by previous urinary tract infections versus interstitial cystitis flares. Urine sent for culture.  4. H/o of stones-   Return in about 6 weeks (around 11/03/2015).  These notes generated with voice recognition software. I apologize for typographical errors.  Earlie Lou, FNP  [facility] [address], [address] [place], [place] [address] [phone]

## 2015-09-24 LAB — CULTURE, URINE COMPREHENSIVE

## 2015-09-28 ENCOUNTER — Telehealth: Payer: Self-pay

## 2015-09-28 NOTE — Telephone Encounter (Signed)
Spoke with pt in reference to -ucx. Pt voiced understanding.  

## 2015-09-28 NOTE — Telephone Encounter (Signed)
-----   Message from Fernanda DrumLindsay C Overton, FNP sent at 09/28/2015  9:53 AM EST ----- His notify patient that her urine culture was negative for infection. Thanks

## 2015-11-03 ENCOUNTER — Ambulatory Visit: Payer: BC Managed Care – PPO | Admitting: Obstetrics and Gynecology

## 2015-11-25 ENCOUNTER — Ambulatory Visit: Payer: BC Managed Care – PPO | Admitting: Obstetrics and Gynecology

## 2016-01-07 ENCOUNTER — Encounter: Payer: Self-pay | Admitting: Obstetrics and Gynecology

## 2016-01-07 ENCOUNTER — Ambulatory Visit (INDEPENDENT_AMBULATORY_CARE_PROVIDER_SITE_OTHER): Payer: BC Managed Care – PPO | Admitting: Obstetrics and Gynecology

## 2016-01-07 VITALS — BP 128/84 | HR 62 | Ht 63.0 in | Wt 189.2 lb

## 2016-01-07 DIAGNOSIS — Z30431 Encounter for routine checking of intrauterine contraceptive device: Secondary | ICD-10-CM

## 2016-01-07 DIAGNOSIS — Z01419 Encounter for gynecological examination (general) (routine) without abnormal findings: Secondary | ICD-10-CM | POA: Diagnosis not present

## 2016-01-07 DIAGNOSIS — E669 Obesity, unspecified: Secondary | ICD-10-CM

## 2016-01-07 NOTE — Patient Instructions (Signed)
  Place annual gynecologic exam patient instructions here.  Thank you for enrolling in MyChart. Please follow the instructions below to securely access your online medical record. MyChart allows you to send messages to your doctor, view your test results, manage appointments, and more.   How Do I Sign Up? 1. In your Internet browser, go to [employer] Address [employer] and enter [email or web address]. 2. Click on the Sign Up Now link in the Sign In box. You will see the New Member Sign Up page. 3. Enter your MyChart Access Code exactly as it appears below. You will not need to use this code after you've completed the sign-up process. If you do not sign up before the expiration date, you must request a new code.  MyChart Access Code: [email or web address] Expires: 03/07/2016  8:09 AM  4. Enter your Social Security Number ([id number]) and Date of Birth (mm/dd/yyyy) as indicated and click Submit. You will be taken to the next sign-up page. 5. Create a MyChart ID. This will be your MyChart login ID and cannot be changed, so think of one that is secure and easy to remember. 6. Create a MyChart password. You can change your password at any time. 7. Enter your Password Reset Question and Answer. This can be used at a later time if you forget your password.  8. Enter your e-mail address. You will receive e-mail notification when new information is available in MyChart. 9. Click Sign Up. You can now view your medical record.   Additional Information Remember, MyChart is NOT to be used for urgent needs. For medical emergencies, dial 911.

## 2016-01-07 NOTE — Progress Notes (Signed)
Subjective:   Shannon Ross is a 45 y.o. 502P1002 Caucasian female here for a routine well-woman exam.  No LMP recorded. Patient is not currently having periods (Reason: IUD).    Current complaints: none PCP: me       does desire labs  Social History: Sexual: heterosexual Marital Status: married Living situation: with family Occupation: Data processing managerassistant teacher at [employer] Tobacco/alcohol: no tobacco use Illicit drugs: no history of illicit drug use  The following portions of the patient's history were reviewed and updated as appropriate: allergies, current medications, past family history, past medical history, past social history, past surgical history and problem list.  Past Medical History Past Medical History  Diagnosis Date  . Fatigue   . GERD (gastroesophageal reflux disease)   . Cough due to bronchospasm   . Complication of anesthesia   . PONV (postoperative nausea and vomiting)   . Recurrent UTI   . Abdominal pain, left lower quadrant   . Heartburn     Past Surgical History Past Surgical History  Procedure Laterality Date  . Knee arthroscopy Left   . Bunionectomy    . Laparoscopy N/A 08/16/2015    Procedure: LAPAROSCOPY OPERATIVE, RIGHT OVARIAN CYST ASPIRATION, FULGERATION/EXCISION OF ENDOMETRIOSIS/BIOPSIES;  Surgeon: Herold HarmsMartin A Defrancesco, MD;  Location: [facility] ORS;  Service: Gynecology;  Laterality: N/A;    Gynecologic History G2P1002  No LMP recorded. Patient is not currently having periods (Reason: IUD). Contraception: IUD Last Pap: 2015. Results were: normal Last mammogram: 2016. Results were: normal  Obstetric History OB History  Gravida Para Term Preterm AB SAB TAB Ectopic Multiple Living  2 1 1       2     # Outcome Date GA Lbr Len/2nd Weight Sex Delivery Anes PTL Lv  2 Term 2004    F Vag-Spont   Y  1 Gravida 2004    M Vag-Spont   Y      Current Medications Current Outpatient Prescriptions on File Prior to Visit  Medication Sig Dispense Refill  . aspirin  EC 81 MG tablet Take 1 tablet (81 mg total) by mouth daily. Take after 12 weeks for prevention of preeclampssia later in pregnancy 300 tablet 2  . levonorgestrel (MIRENA) 20 MCG/24HR IUD 1 each by Intrauterine route once.    Marland Kitchen. omeprazole (PRILOSEC) 40 MG capsule Take 40 mg by mouth daily.    . cetirizine (ZYRTEC) 10 MG tablet Take by mouth. Reported on 01/07/2016    . cyanocobalamin (,VITAMIN B-12,) 1000 MCG/ML injection Inject 1 mL (1,000 mcg total) into the muscle every 30 (thirty) days. (Patient not taking: Reported on 01/07/2016) 10 mL 1  . nitrofurantoin, macrocrystal-monohydrate, (MACROBID) 100 MG capsule Take 1 capsule (100 mg total) by mouth 2 (two) times daily. (Patient not taking: Reported on 09/06/2015) 14 capsule 0  . traMADol (ULTRAM) 50 MG tablet Take 1 tablet (50 mg total) by mouth 2 (two) times daily. (Patient not taking: Reported on 09/06/2015) 60 tablet 0  . Vitamin D, Ergocalciferol, (DRISDOL) 50000 UNITS CAPS capsule TAKE 1 CAPSULE BY MOUTH TWICE A WEEK (Patient not taking: Reported on 01/07/2016) 24 capsule 0   No current facility-administered medications on file prior to visit.    Review of Systems Patient denies any headaches, blurred vision, shortness of breath, chest pain, abdominal pain, problems with bowel movements, urination, or intercourse.  Objective:  BP 128/84 mmHg  Pulse 62  Ht 5\' 3"  (1.6 m)  Wt 189 lb 3.2 oz (85.821 kg)  BMI 33.52 kg/m2 Physical Exam  General:  Well developed, well nourished, no acute distress. She is alert and oriented x3. Skin:  Warm and dry Neck:  Midline trachea, no thyromegaly or nodules Cardiovascular: Regular rate and rhythm, no murmur heard Lungs:  Effort normal, all lung fields clear to auscultation bilaterally Breasts:  No dominant palpable mass, retraction, or nipple discharge Abdomen:  Soft, non tender, no hepatosplenomegaly or masses Pelvic:  External genitalia is normal in appearance.  The vagina is normal in appearance. The  cervix is bulbous, no CMT.  Thin prep pap is not done. Uterus is felt to be normal size, shape, and contour.  No adnexal masses or tenderness noted. Extremities:  No swelling or varicosities noted Psych:  She has a normal mood and affect  Assessment:   Healthy well-woman exam  Plan:  Labs obtained F/U 1 year for AE, or sooner if needed Mammogram scheduled  Melody Suzan Nailer, CNM

## 2016-01-08 LAB — HEMOGLOBIN A1C
ESTIMATED AVERAGE GLUCOSE: 111 mg/dL
HEMOGLOBIN A1C: 5.5 % (ref 4.8–5.6)

## 2016-01-08 LAB — LIPID PANEL
Chol/HDL Ratio: 3.9 ratio units (ref 0.0–4.4)
Cholesterol, Total: 190 mg/dL (ref 100–199)
HDL: 49 mg/dL (ref >39)
LDL CALC: 127 mg/dL — AB (ref 0–99)
Triglycerides: 72 mg/dL (ref 0–149)
VLDL CHOLESTEROL CAL: 14 mg/dL (ref 5–40)

## 2016-01-08 LAB — COMPREHENSIVE METABOLIC PANEL
ALBUMIN: 4.2 g/dL (ref 3.5–5.5)
ALK PHOS: 96 IU/L (ref 39–117)
ALT: 27 IU/L (ref 0–32)
AST: 27 IU/L (ref 0–40)
Albumin/Globulin Ratio: 1.4 (ref 1.2–2.2)
BUN/Creatinine Ratio: 18 (ref 9–23)
BUN: 18 mg/dL (ref 6–24)
Bilirubin Total: 0.4 mg/dL (ref 0.0–1.2)
CO2: 26 mmol/L (ref 18–29)
CREATININE: 1 mg/dL (ref 0.57–1.00)
Calcium: 9.4 mg/dL (ref 8.7–10.2)
Chloride: 103 mmol/L (ref 96–106)
GFR calc Af Amer: 80 mL/min/{1.73_m2} (ref >59)
GFR calc non Af Amer: 70 mL/min/{1.73_m2} (ref >59)
GLUCOSE: 90 mg/dL (ref 65–99)
Globulin, Total: 2.9 g/dL (ref 1.5–4.5)
Potassium: 4.1 mmol/L (ref 3.5–5.2)
Sodium: 144 mmol/L (ref 134–144)
Total Protein: 7.1 g/dL (ref 6.0–8.5)

## 2016-01-08 LAB — VITAMIN D 25 HYDROXY (VIT D DEFICIENCY, FRACTURES): VIT D 25 HYDROXY: 26.9 ng/mL — AB (ref 30.0–100.0)

## 2016-01-11 ENCOUNTER — Telehealth: Payer: Self-pay | Admitting: *Deleted

## 2016-01-11 NOTE — Telephone Encounter (Signed)
Mailed all info to pt 

## 2016-01-11 NOTE — Telephone Encounter (Signed)
-----   Message from Purcell NailsMelody N Shambley, [place] sent at 01/11/2016  3:30 PM EDT ----- Please let her know lab results, needs to keep taking vit D and please send in refill if she needs it.

## 2016-02-12 ENCOUNTER — Other Ambulatory Visit: Payer: Self-pay | Admitting: Obstetrics and Gynecology

## 2016-03-15 ENCOUNTER — Other Ambulatory Visit: Payer: Self-pay | Admitting: Obstetrics and Gynecology

## 2016-03-22 ENCOUNTER — Ambulatory Visit (INDEPENDENT_AMBULATORY_CARE_PROVIDER_SITE_OTHER): Payer: BC Managed Care – PPO | Admitting: Obstetrics and Gynecology

## 2016-03-22 ENCOUNTER — Encounter: Payer: Self-pay | Admitting: Obstetrics and Gynecology

## 2016-03-22 VITALS — BP 127/85 | HR 62 | Wt 201.2 lb

## 2016-03-22 DIAGNOSIS — R5383 Other fatigue: Secondary | ICD-10-CM

## 2016-03-22 DIAGNOSIS — M2550 Pain in unspecified joint: Secondary | ICD-10-CM | POA: Diagnosis not present

## 2016-03-22 DIAGNOSIS — E559 Vitamin D deficiency, unspecified: Secondary | ICD-10-CM

## 2016-03-22 DIAGNOSIS — E669 Obesity, unspecified: Secondary | ICD-10-CM | POA: Diagnosis not present

## 2016-03-22 MED ORDER — CYANOCOBALAMIN 1000 MCG/ML IJ SOLN: 1000.0000 ug | INTRAMUSCULAR | Status: DC

## 2016-03-22 MED ORDER — PHENTERMINE HCL 37.5 MG PO TABS: 37.5000 mg | ORAL_TABLET | Freq: Every day | ORAL | Status: DC

## 2016-03-22 NOTE — Progress Notes (Signed)
Subjective:     Patient ID: Shannon Ross, female   DOB: April 18, 1971, 45 y.o.   MRN: [id number]  HPI Reports worsening fatigue with all over joint pain (worse in am with stiffness), weight gain, increased sleepiness. pedal edema at night.  Concerned due to family h/o arthritis.  Review of Systems See above    Objective:   Physical Exam A&O x4 Well groomed female in no distress Filed Vitals:   03/22/16 0936  Weight: 201 lb 3.2 oz (91.264 kg)  HRR  Thyroid not enlarged. No edema noted at this time Lower extremities with multiple varicosities.     Assessment:     Joint pain  Obesity Fatigue Malaise Vit D Deficiency     Plan:     Labs obtained Recommend Green smoothie detox Restart phentermine and B12-injection given today after labs RTC as needed and in 4 weeks for nurse visit  [place], [facility]

## 2016-03-22 NOTE — Patient Instructions (Signed)
10-day green Smoothie detox

## 2016-03-27 ENCOUNTER — Telehealth: Payer: Self-pay | Admitting: Obstetrics and Gynecology

## 2016-03-27 NOTE — Telephone Encounter (Signed)
Shannon Ross called for her lab results please call her

## 2016-03-29 NOTE — Telephone Encounter (Signed)
Pt called and didn't relize taht you were not here Monday left a message and she was told you would be back Tuesday, she called today and told her that you decided to take Tuesday off but you were here today, she just wanted to know the results of her labs from last week.

## 2016-03-30 ENCOUNTER — Other Ambulatory Visit: Payer: Self-pay

## 2016-03-30 ENCOUNTER — Other Ambulatory Visit: Payer: BC Managed Care – PPO

## 2016-03-30 DIAGNOSIS — M2550 Pain in unspecified joint: Secondary | ICD-10-CM

## 2016-03-30 LAB — CBC
HEMATOCRIT: 45.6 % (ref 34.0–46.6)
HEMOGLOBIN: 15 g/dL (ref 11.1–15.9)
MCH: 30.6 pg (ref 26.6–33.0)
MCHC: 32.9 g/dL (ref 31.5–35.7)
MCV: 93 fL (ref 79–97)
Platelets: 172 10*3/uL (ref 150–379)
RBC: 4.9 x10E6/uL (ref 3.77–5.28)
RDW: 13.4 % (ref 12.3–15.4)
WBC: 8.8 10*3/uL (ref 3.4–10.8)

## 2016-03-30 LAB — COMPREHENSIVE METABOLIC PANEL
ALT: 32 IU/L (ref 0–32)
AST: 26 IU/L (ref 0–40)
Albumin/Globulin Ratio: 1.7 (ref 1.2–2.2)
Albumin: 4.1 g/dL (ref 3.5–5.5)
Alkaline Phosphatase: 90 IU/L (ref 39–117)
BUN/Creatinine Ratio: 18 (ref 9–23)
BUN: 14 mg/dL (ref 6–24)
Bilirubin Total: 0.2 mg/dL (ref 0.0–1.2)
CALCIUM: 8.8 mg/dL (ref 8.7–10.2)
CHLORIDE: 103 mmol/L (ref 96–106)
CO2: 25 mmol/L (ref 18–29)
Creatinine, Ser: 0.76 mg/dL (ref 0.57–1.00)
GFR, EST AFRICAN AMERICAN: 111 mL/min/{1.73_m2} (ref >59)
GFR, EST NON AFRICAN AMERICAN: 96 mL/min/{1.73_m2} (ref >59)
GLUCOSE: 89 mg/dL (ref 65–99)
Globulin, Total: 2.4 g/dL (ref 1.5–4.5)
Potassium: 4.6 mmol/L (ref 3.5–5.2)
Sodium: 144 mmol/L (ref 134–144)
TOTAL PROTEIN: 6.5 g/dL (ref 6.0–8.5)

## 2016-03-30 LAB — RHEUMATOID ARTHRITIS PROFILE: Cyclic Citrullin Peptide Ab: 6 units (ref 0–19)

## 2016-03-30 LAB — VITAMIN D 25 HYDROXY (VIT D DEFICIENCY, FRACTURES): Vit D, 25-Hydroxy: 22.1 ng/mL — ABNORMAL LOW (ref 30.0–100.0)

## 2016-03-30 LAB — HGE(IGG/M)+LYMEAB(IGM)+RKYIGM
HGE IGM TITER: NEGATIVE
HGE IgG Titer: NEGATIVE
RMSF IGM: 0.76 {index} (ref 0.00–0.89)

## 2016-03-30 LAB — THYROID PANEL WITH TSH
FREE THYROXINE INDEX: 1.8 (ref 1.2–4.9)
T3 Uptake Ratio: 28 % (ref 24–39)
T4, Total: 6.5 ug/dL (ref 4.5–12.0)
TSH: 1.42 u[IU]/mL (ref 0.450–4.500)

## 2016-03-30 LAB — VITAMIN B12: VITAMIN B 12: 689 pg/mL (ref 211–946)

## 2016-04-01 LAB — RHEUMATOID ARTHRITIS PROFILE: Cyclic Citrullin Peptide Ab: 5 units (ref 0–19)

## 2016-04-04 ENCOUNTER — Other Ambulatory Visit: Payer: Self-pay | Admitting: *Deleted

## 2016-04-04 ENCOUNTER — Telehealth: Payer: Self-pay | Admitting: Obstetrics and Gynecology

## 2016-04-04 ENCOUNTER — Other Ambulatory Visit: Payer: Self-pay | Admitting: Obstetrics and Gynecology

## 2016-04-04 DIAGNOSIS — M2550 Pain in unspecified joint: Secondary | ICD-10-CM

## 2016-04-04 MED ORDER — VITAMIN D (ERGOCALCIFEROL) 1.25 MG (50000 UNIT) PO CAPS: ORAL_CAPSULE | ORAL | Status: DC

## 2016-04-04 MED ORDER — DICLOFENAC SODIUM 75 MG PO TBEC
75.0000 mg | DELAYED_RELEASE_TABLET | Freq: Two times a day (BID) | ORAL | Status: DC | PRN
Start: 2016-04-04 — End: 2021-10-06

## 2016-04-04 NOTE — Telephone Encounter (Signed)
Please remind her to get on MyChart, and all labs were normal except Vit D. I am OK with trying diclofinac bid, and recommend a referral to ortho-see if she has a choice?

## 2016-04-04 NOTE — Telephone Encounter (Signed)
Shannon Ross SAID SHE HAD  SOME LABS AND HAS'NT GOT RESULTS YET. SHE WANTS YOU TO CALL HER

## 2016-04-04 NOTE — Telephone Encounter (Signed)
Spoke with pt

## 2016-04-04 NOTE — Telephone Encounter (Signed)
Shannon Ross can she try some voltaren 75mg 

## 2016-04-05 NOTE — Telephone Encounter (Signed)
-----   Message from Purcell NailsMelody N Shambley, [place] sent at 04/04/2016  9:24 AM EDT ----- Please let her know all labs were normal and RA was negative, but vit D is still low, needs to continue vit supplement

## 2016-04-05 NOTE — Telephone Encounter (Signed)
[facility] thanks

## 2016-04-05 NOTE — Telephone Encounter (Signed)
Notified pt of lab results 

## 2016-04-06 NOTE — Telephone Encounter (Signed)
Referral done

## 2016-04-30 ENCOUNTER — Other Ambulatory Visit: Payer: Self-pay | Admitting: Obstetrics and Gynecology

## 2016-06-06 ENCOUNTER — Encounter: Payer: Self-pay | Admitting: Obstetrics and Gynecology

## 2016-06-06 ENCOUNTER — Ambulatory Visit (INDEPENDENT_AMBULATORY_CARE_PROVIDER_SITE_OTHER): Payer: BC Managed Care – PPO | Admitting: Obstetrics and Gynecology

## 2016-06-06 VITALS — BP 136/80 | HR 79 | Ht 63.0 in | Wt 193.1 lb

## 2016-06-06 DIAGNOSIS — R030 Elevated blood-pressure reading, without diagnosis of hypertension: Secondary | ICD-10-CM | POA: Diagnosis not present

## 2016-06-06 NOTE — Patient Instructions (Signed)
Thank you for enrolling in MyChart. Please follow the instructions below to securely access your online medical record. MyChart allows you to send messages to your doctor, view your test results, renew your prescriptions, schedule appointments, and more.  How Do I Sign Up? 1. In your Internet browser, go to [email or web address]. 2. Click on the New  User? link in the Sign In box.  3. Enter your MyChart Access Code exactly as it appears below. You will not need to use this code after you have completed the sign-up process. If you do not sign up before the expiration date, you must request a new code. MyChart Access Code: [email or web address] Expires: 08/05/2016  8:31 AM  4. Enter the last four digits of your Social Security Number (xxxx) and Date of Birth (mm/dd/yyyy) as indicated and click Next. You will be taken to the next sign-up page. 5. Create a MyChart ID. This will be your MyChart login ID and cannot be changed, so think of one that is secure and easy to remember. 6. Create a MyChart password. You can change your password at any time. 7. Enter your Password Reset Question and Answer and click Next. This can be used at a later time if you forget your password.  8. Select your communication preference, and if applicable enter your e-mail address. You will receive e-mail notification when new information is available in MyChart by choosing to receive e-mail notifications and filling in your e-mail. 9. Click Sign In. You can now view your medical record.   Additional Information If you have questions, you can email [email or web address] or call [phone] to talk to our MyChart staff. Remember, MyChart is NOT to be used for urgent needs. For medical emergencies, dial 911.

## 2016-06-06 NOTE — Progress Notes (Signed)
Subjective:     Patient ID: Shannon Ross, female   DOB: 09-Mar-1971, 45 y.o.   MRN: [id number]  HPI Reports elevated BP over last few weeks. Yesterdays readings were 148/106 and 139/90. Concerned as mother had a stroke last year. Reports mild headache at times. Added cymbalta 2 months ago. Feeling much better and now able to exercise. Has lost 10#.  Review of Systems negative except stated in HPI    Objective:   Physical Exam A&O x4 Well groomed female in no distress Blood pressure 136/80, pulse 79, height '5\' 3"'$  (1.6 m), weight 193 lb 1.6 oz (87.6 kg). Recheck on BP 128/86. Thyroid normal on exam HRR No pedal edema     Assessment:     Elevated BP Obesity     Plan:     Stop phentermine Continue to check BP 2 x days.  will consider adding HCTZ if BP continues to remain elevated. >50% of 15 minute visit spent in counseling. RTC 1 month.  Gaylen Venning [place], CNM

## 2016-06-10 ENCOUNTER — Encounter: Payer: Self-pay | Admitting: Obstetrics and Gynecology

## 2016-06-11 ENCOUNTER — Encounter: Payer: Self-pay | Admitting: Obstetrics and Gynecology

## 2016-06-13 ENCOUNTER — Encounter: Payer: Self-pay | Admitting: Obstetrics and Gynecology

## 2016-06-13 ENCOUNTER — Other Ambulatory Visit: Payer: Self-pay | Admitting: Obstetrics and Gynecology

## 2016-06-13 MED ORDER — HYDROCHLOROTHIAZIDE 25 MG PO TABS
25.0000 mg | ORAL_TABLET | Freq: Every day | ORAL | 3 refills | Status: DC
Start: 2016-06-13 — End: 2017-07-13

## 2017-02-20 ENCOUNTER — Other Ambulatory Visit: Payer: Self-pay | Admitting: Obstetrics and Gynecology

## 2017-06-06 ENCOUNTER — Encounter: Payer: Self-pay | Admitting: Obstetrics and Gynecology

## 2017-06-06 ENCOUNTER — Other Ambulatory Visit: Payer: Self-pay | Admitting: Obstetrics and Gynecology

## 2017-06-06 ENCOUNTER — Ambulatory Visit (INDEPENDENT_AMBULATORY_CARE_PROVIDER_SITE_OTHER): Payer: BC Managed Care – PPO | Admitting: Obstetrics and Gynecology

## 2017-06-06 VITALS — BP 131/81 | HR 60 | Ht 63.0 in | Wt 194.7 lb

## 2017-06-06 DIAGNOSIS — Z01419 Encounter for gynecological examination (general) (routine) without abnormal findings: Secondary | ICD-10-CM | POA: Diagnosis not present

## 2017-06-06 DIAGNOSIS — E538 Deficiency of other specified B group vitamins: Secondary | ICD-10-CM

## 2017-06-06 DIAGNOSIS — T8332XA Displacement of intrauterine contraceptive device, initial encounter: Secondary | ICD-10-CM | POA: Diagnosis not present

## 2017-06-06 MED ORDER — CYANOCOBALAMIN 1000 MCG/ML IJ SOLN: 1000.0000 ug | Freq: Once | INTRAMUSCULAR | 11 refills | Status: AC

## 2017-06-06 NOTE — Patient Instructions (Signed)
Preventive Care 18-39 Years, Female Preventive care refers to lifestyle choices and visits with your health care provider that can promote health and wellness. What does preventive care include?  A yearly physical exam. This is also called an annual well check.  Dental exams once or twice a year.  Routine eye exams. Ask your health care provider how often you should have your eyes checked.  Personal lifestyle choices, including: ? Daily care of your teeth and gums. ? Regular physical activity. ? Eating a healthy diet. ? Avoiding tobacco and drug use. ? Limiting alcohol use. ? Practicing safe sex. ? Taking vitamin and mineral supplements as recommended by your health care provider. What happens during an annual well check? The services and screenings done by your health care provider during your annual well check will depend on your age, overall health, lifestyle risk factors, and family history of disease. Counseling Your health care provider may ask you questions about your:  Alcohol use.  Tobacco use.  Drug use.  Emotional well-being.  Home and relationship well-being.  Sexual activity.  Eating habits.  Work and work Statistician.  Method of birth control.  Menstrual cycle.  Pregnancy history.  Screening You may have the following tests or measurements:  Height, weight, and BMI.  Diabetes screening. This is done by checking your blood sugar (glucose) after you have not eaten for a while (fasting).  Blood pressure.  Lipid and cholesterol levels. These may be checked every 5 years starting at age 38.  Skin check.  Hepatitis C blood test.  Hepatitis B blood test.  Sexually transmitted disease (STD) testing.  BRCA-related cancer screening. This may be done if you have a family history of breast, ovarian, tubal, or peritoneal cancers.  Pelvic exam and Pap test. This may be done every 3 years starting at age 38. Starting at age 30, this may be done  every 5 years if you have a Pap test in combination with an HPV test.  Discuss your test results, treatment options, and if necessary, the need for more tests with your health care provider. Vaccines Your health care provider may recommend certain vaccines, such as:  Influenza vaccine. This is recommended every year.  Tetanus, diphtheria, and acellular pertussis (Tdap, Td) vaccine. You may need a Td booster every 10 years.  Varicella vaccine. You may need this if you have not been vaccinated.  HPV vaccine. If you are 39 or younger, you may need three doses over 6 months.  Measles, mumps, and rubella (MMR) vaccine. You may need at least one dose of MMR. You may also need a second dose.  Pneumococcal 13-valent conjugate (PCV13) vaccine. You may need this if you have certain conditions and were not previously vaccinated.  Pneumococcal polysaccharide (PPSV23) vaccine. You may need one or two doses if you smoke cigarettes or if you have certain conditions.  Meningococcal vaccine. One dose is recommended if you are age 68-21 years and a first-year college student living in a residence hall, or if you have one of several medical conditions. You may also need additional booster doses.  Hepatitis A vaccine. You may need this if you have certain conditions or if you travel or work in places where you may be exposed to hepatitis A.  Hepatitis B vaccine. You may need this if you have certain conditions or if you travel or work in places where you may be exposed to hepatitis B.  Haemophilus influenzae type b (Hib) vaccine. You may need this  if you have certain risk factors.  Talk to your health care provider about which screenings and vaccines you need and how often you need them. This information is not intended to replace advice given to you by your health care provider. Make sure you discuss any questions you have with your health care provider. Document Released: 10/31/2001 Document Revised:  05/24/2016 Document Reviewed: 07/06/2015 Elsevier Interactive Patient Education  2017 Elsevier Inc.  

## 2017-06-06 NOTE — Progress Notes (Signed)
Subjective:   Shannon Ross is a 46 y.o. G50P1002 Caucasian female here for a routine well-woman exam.  No LMP recorded. Patient is not currently having periods (Reason: IUD).    Current complaints: none PCP: Toni Arthurs    does desire labs  Social History: Sexual: heterosexual Marital Status: married Living situation: with family Occupation: Sub at [employer] Tobacco/alcohol: no tobacco use Illicit drugs: no history of illicit drug use  The following portions of the patient's history were reviewed and updated as appropriate: allergies, current medications, past family history, past medical history, past social history, past surgical history and problem list.  Past Medical History Past Medical History:  Diagnosis Date  . Abdominal pain, left lower quadrant   . Complication of anesthesia   . Cough due to bronchospasm   . Fatigue   . GERD (gastroesophageal reflux disease)   . Heartburn   . PONV (postoperative nausea and vomiting)   . Recurrent UTI     Past Surgical History Past Surgical History:  Procedure Laterality Date  . BUNIONECTOMY    . KNEE ARTHROSCOPY Left   . LAPAROSCOPY N/A 08/16/2015   Procedure: LAPAROSCOPY OPERATIVE, RIGHT OVARIAN CYST ASPIRATION, FULGERATION/EXCISION OF ENDOMETRIOSIS/BIOPSIES;  Surgeon: Herold Harms, MD;  Location: [facility] ORS;  Service: Gynecology;  Laterality: N/A;    Gynecologic History G2P1002  No LMP recorded. Patient is not currently having periods (Reason: IUD). Contraception: IUD Last Pap: 2014. Results were: normal Last mammogram: 2015. Results were: normal   Obstetric History OB History  Gravida Para Term Preterm AB Living  SAB TAB Ectopic Multiple Live Births          2    # Outcome Date GA Lbr Len/2nd Weight Sex Delivery Anes PTL Lv  2 Term 2004    F Vag-Spont   LIV  1 Gravida 2004    M Vag-Spont   LIV      Current Medications Current Outpatient Prescriptions on File Prior to Visit  Medication Sig Dispense  Refill  . Cranberry 1000 MG CAPS Take by mouth.    . DULoxetine (CYMBALTA) 60 MG capsule Take 60 mg by mouth daily.    . hydrochlorothiazide (HYDRODIURIL) 25 MG tablet Take 1 tablet (25 mg total) by mouth daily. 30 tablet 3  . levonorgestrel (MIRENA) 20 MCG/24HR IUD 1 each by Intrauterine route once.    Marland Kitchen omeprazole (PRILOSEC) 40 MG capsule TAKE 1 CAPSULE BY MOUTH TWICE DAILY 60 capsule 3  . cetirizine (ZYRTEC) 10 MG tablet Take by mouth. Reported on 03/22/2016    . diclofenac (VOLTAREN) 75 MG EC tablet Take 1 tablet (75 mg total) by mouth 2 (two) times daily as needed. (Patient not taking: Reported on 06/06/2016) 60 tablet 3  . nitrofurantoin, macrocrystal-monohydrate, (MACROBID) 100 MG capsule Take 1 capsule (100 mg total) by mouth 2 (two) times daily. (Patient not taking: Reported on 06/06/2016) 14 capsule 0  . Vitamin D, Ergocalciferol, (DRISDOL) 50000 units CAPS capsule TAKE 1 CAPSULE BY MOUTH TWICE A WEEK (Patient not taking: Reported on 06/06/2017) 24 capsule 1   No current facility-administered medications on file prior to visit.     Review of Systems Patient denies any headaches, blurred vision, shortness of breath, chest pain, abdominal pain, problems with bowel movements, urination, or intercourse.  Objective:  BP 131/81   Pulse 60   Ht  (1.6 m)   Wt 194 lb 11.2 oz (88.3 kg)   BMI 34.49 kg/m  Physical Exam  General:  Well developed, well nourished, no acute distress. She is alert and oriented x3. Skin:  Warm and dry Neck:  Midline trachea, no thyromegaly or nodules Cardiovascular: Regular rate and rhythm, no murmur heard Lungs:  Effort normal, all lung fields clear to auscultation bilaterally Breasts:  No dominant palpable mass, retraction, or nipple discharge Abdomen:  Soft, non tender, no hepatosplenomegaly or masses Pelvic:  External genitalia is normal in appearance.  The vagina is normal in appearance. The cervix is bulbous, no CMT. Couldn't visualize strings  Thin  prep pap is done with HR HPV cotesting. Uterus is felt to be normal size, shape, and contour.  No adnexal masses or tenderness noted. Extremities:  No swelling or varicosities noted Psych:  She has a normal mood and affect  Assessment:   Healthy well-woman exam Fibromyalgia Obesity IUD check   Plan:  Labs obtained-will follow up accordingly B12 injection given and refills sent (work nurse gives them) Pelvic ultrasound ordere berfore IUD removal-with in next 2 months, as IUD is due to be replaced. F/U 1 year for AE, or sooner if needed Mammogram ordered or sooner if problems   Kaisy Severino Suzan Nailer, CNM

## 2017-06-07 LAB — CYTOLOGY - PAP

## 2017-06-07 LAB — B12 AND FOLATE PANEL
Folate: 12.6 ng/mL (ref >3.0)
Vitamin B-12: 874 pg/mL (ref 232–1245)

## 2017-06-08 LAB — THYROID PANEL WITH TSH
Free Thyroxine Index: 2.2 (ref 1.2–4.9)
T3 Uptake Ratio: 28 % (ref 24–39)
T4 TOTAL: 7.8 ug/dL (ref 4.5–12.0)
TSH: 0.964 u[IU]/mL (ref 0.450–4.500)

## 2017-06-08 LAB — COMPREHENSIVE METABOLIC PANEL
ALK PHOS: 101 IU/L (ref 39–117)
ALT: 18 IU/L (ref 0–32)
AST: 21 IU/L (ref 0–40)
Albumin/Globulin Ratio: 1.6 (ref 1.2–2.2)
Albumin: 4.4 g/dL (ref 3.5–5.5)
BUN/Creatinine Ratio: 15 (ref 9–23)
BUN: 17 mg/dL (ref 6–24)
Bilirubin Total: 0.5 mg/dL (ref 0.0–1.2)
CALCIUM: 9.7 mg/dL (ref 8.7–10.2)
CO2: 25 mmol/L (ref 20–29)
CREATININE: 1.11 mg/dL — AB (ref 0.57–1.00)
Chloride: 98 mmol/L (ref 96–106)
GFR calc Af Amer: 70 mL/min/{1.73_m2} (ref >59)
GFR, EST NON AFRICAN AMERICAN: 61 mL/min/{1.73_m2} (ref >59)
GLUCOSE: 105 mg/dL — AB (ref 65–99)
Globulin, Total: 2.8 g/dL (ref 1.5–4.5)
Potassium: 4.5 mmol/L (ref 3.5–5.2)
SODIUM: 143 mmol/L (ref 134–144)
Total Protein: 7.2 g/dL (ref 6.0–8.5)

## 2017-06-08 LAB — HEMOGLOBIN A1C
ESTIMATED AVERAGE GLUCOSE: 111 mg/dL
HEMOGLOBIN A1C: 5.5 % (ref 4.8–5.6)

## 2017-06-08 LAB — LIPID PANEL
CHOL/HDL RATIO: 4 ratio (ref 0.0–4.4)
Cholesterol, Total: 174 mg/dL (ref 100–199)
HDL: 44 mg/dL (ref >39)
LDL CALC: 109 mg/dL — AB (ref 0–99)
Triglycerides: 106 mg/dL (ref 0–149)
VLDL CHOLESTEROL CAL: 21 mg/dL (ref 5–40)

## 2017-06-08 LAB — VITAMIN D 25 HYDROXY (VIT D DEFICIENCY, FRACTURES): VIT D 25 HYDROXY: 38.9 ng/mL (ref 30.0–100.0)

## 2017-06-13 ENCOUNTER — Other Ambulatory Visit: Payer: BC Managed Care – PPO

## 2017-06-13 ENCOUNTER — Encounter: Payer: Self-pay | Admitting: Obstetrics and Gynecology

## 2017-06-13 ENCOUNTER — Other Ambulatory Visit: Payer: Self-pay | Admitting: Obstetrics and Gynecology

## 2017-06-13 DIAGNOSIS — R300 Dysuria: Secondary | ICD-10-CM

## 2017-06-16 LAB — URINE CULTURE

## 2017-06-18 ENCOUNTER — Encounter: Payer: Self-pay | Admitting: Obstetrics and Gynecology

## 2017-06-19 ENCOUNTER — Other Ambulatory Visit: Payer: Self-pay | Admitting: Obstetrics and Gynecology

## 2017-06-19 MED ORDER — METRONIDAZOLE 500 MG PO TABS: 500.0000 mg | ORAL_TABLET | Freq: Two times a day (BID) | ORAL | 0 refills | Status: DC

## 2017-07-13 ENCOUNTER — Other Ambulatory Visit: Payer: Self-pay | Admitting: Obstetrics and Gynecology

## 2017-08-08 ENCOUNTER — Ambulatory Visit: Payer: BC Managed Care – PPO | Admitting: Obstetrics and Gynecology

## 2017-08-08 ENCOUNTER — Other Ambulatory Visit: Payer: BC Managed Care – PPO

## 2017-08-17 ENCOUNTER — Ambulatory Visit (INDEPENDENT_AMBULATORY_CARE_PROVIDER_SITE_OTHER): Payer: BC Managed Care – PPO | Admitting: Obstetrics and Gynecology

## 2017-08-17 ENCOUNTER — Encounter: Payer: Self-pay | Admitting: Obstetrics and Gynecology

## 2017-08-17 ENCOUNTER — Ambulatory Visit (INDEPENDENT_AMBULATORY_CARE_PROVIDER_SITE_OTHER): Payer: BC Managed Care – PPO

## 2017-08-17 VITALS — BP 121/80 | HR 76 | Ht 63.0 in | Wt 195.3 lb

## 2017-08-17 DIAGNOSIS — Z30433 Encounter for removal and reinsertion of intrauterine contraceptive device: Secondary | ICD-10-CM | POA: Diagnosis not present

## 2017-08-17 DIAGNOSIS — T8332XA Displacement of intrauterine contraceptive device, initial encounter: Secondary | ICD-10-CM | POA: Diagnosis not present

## 2017-08-17 NOTE — Patient Instructions (Signed)

## 2017-08-17 NOTE — Progress Notes (Signed)
Shannon Ross is a 46 y.o. year old 362P1002 Caucasian female who presents for removal and replacement of a Mirena IUD. She was given informed consent for removal and reinsertion of her Mirena. Her Mirena was placed 2012, No LMP recorded. Patient is not currently having periods (Reason: IUD)., and her pregnancy test today was negative.   The risks and benefits of the method and placement have been thouroughly reviewed with the patient and all questions were answered.  Specifically the patient is aware of failure rate of 09/998, expulsion of the IUD and of possible perforation.  The patient is aware of irregular bleeding due to the method and understands the incidence of irregular bleeding diminishes with time.  Signed copy of informed consent in chart.   No LMP recorded. Patient is not currently having periods (Reason: IUD). BP 121/80   Pulse 76   Ht 5\' 3"  (1.6 m)   Wt 195 lb 4.8 oz (88.6 kg)   BMI 34.60 kg/m  No results found for this or any previous visit (from the past 24 hour(s)).   Appropriate time out taken. A graves speculum was placed in the vagina.  The cervix was visualized, prepped using Betadine. The strings were visible. They were grasped and the Mirena was easily removed. The cervix was then grasped with a single-tooth tenaculum. The uterus was found to be anteroflexed and it sounded to 7 cm.  Mirena IUD placed per manufacturer's recommendations without complications. The strings were trimmed to 3 cm.  The patient tolerated the procedure well.  The patient was given post procedure instructions, including signs and symptoms of infection and to check for the strings after each menses or each month, and refraining from intercourse or anything in the vagina for 3 days.  She was given a Mirena care card with date Mirena placed, and date Mirena to be removed.    Melody Suzan NailerN Shambley, CNM

## 2017-09-12 ENCOUNTER — Ambulatory Visit: Payer: BC Managed Care – PPO | Attending: Neurology

## 2017-09-12 DIAGNOSIS — G4733 Obstructive sleep apnea (adult) (pediatric): Secondary | ICD-10-CM | POA: Diagnosis not present

## 2017-09-12 DIAGNOSIS — R0683 Snoring: Secondary | ICD-10-CM | POA: Insufficient documentation

## 2017-09-12 DIAGNOSIS — E669 Obesity, unspecified: Secondary | ICD-10-CM | POA: Diagnosis not present

## 2017-09-12 DIAGNOSIS — I10 Essential (primary) hypertension: Secondary | ICD-10-CM | POA: Insufficient documentation

## 2017-09-12 DIAGNOSIS — Z6834 Body mass index (BMI) 34.0-34.9, adult: Secondary | ICD-10-CM | POA: Diagnosis not present

## 2017-09-19 ENCOUNTER — Ambulatory Visit: Payer: BC Managed Care – PPO | Admitting: Obstetrics and Gynecology

## 2017-09-27 ENCOUNTER — Other Ambulatory Visit: Payer: Self-pay | Admitting: Obstetrics and Gynecology

## 2017-09-28 ENCOUNTER — Ambulatory Visit: Payer: BC Managed Care – PPO | Attending: Neurology

## 2017-09-28 ENCOUNTER — Encounter: Payer: BC Managed Care – PPO | Admitting: Obstetrics and Gynecology

## 2017-09-28 DIAGNOSIS — G4733 Obstructive sleep apnea (adult) (pediatric): Secondary | ICD-10-CM | POA: Insufficient documentation

## 2017-10-11 ENCOUNTER — Other Ambulatory Visit: Payer: Self-pay | Admitting: Unknown Physician Specialty

## 2017-10-11 DIAGNOSIS — J385 Laryngeal spasm: Secondary | ICD-10-CM

## 2017-10-28 ENCOUNTER — Other Ambulatory Visit: Payer: Self-pay | Admitting: Obstetrics and Gynecology

## 2017-11-09 ENCOUNTER — Ambulatory Visit: Payer: BC Managed Care – PPO

## 2018-01-16 ENCOUNTER — Other Ambulatory Visit: Payer: BC Managed Care – PPO

## 2018-01-16 DIAGNOSIS — E669 Obesity, unspecified: Secondary | ICD-10-CM

## 2018-01-16 DIAGNOSIS — E559 Vitamin D deficiency, unspecified: Secondary | ICD-10-CM

## 2018-01-16 DIAGNOSIS — R5383 Other fatigue: Secondary | ICD-10-CM

## 2018-01-16 DIAGNOSIS — E538 Deficiency of other specified B group vitamins: Secondary | ICD-10-CM

## 2018-01-17 ENCOUNTER — Encounter: Payer: Self-pay | Admitting: Obstetrics and Gynecology

## 2018-01-17 LAB — COMPREHENSIVE METABOLIC PANEL
ALBUMIN: 4 g/dL (ref 3.5–5.5)
ALT: 19 IU/L (ref 0–32)
AST: 20 IU/L (ref 0–40)
Albumin/Globulin Ratio: 1.6 (ref 1.2–2.2)
Alkaline Phosphatase: 92 IU/L (ref 39–117)
BILIRUBIN TOTAL: 0.4 mg/dL (ref 0.0–1.2)
BUN / CREAT RATIO: 19 (ref 9–23)
BUN: 20 mg/dL (ref 6–24)
CHLORIDE: 105 mmol/L (ref 96–106)
CO2: 25 mmol/L (ref 20–29)
CREATININE: 1.05 mg/dL — AB (ref 0.57–1.00)
Calcium: 9.2 mg/dL (ref 8.7–10.2)
GFR calc non Af Amer: 65 mL/min/{1.73_m2} (ref >59)
GFR, EST AFRICAN AMERICAN: 75 mL/min/{1.73_m2} (ref >59)
GLOBULIN, TOTAL: 2.5 g/dL (ref 1.5–4.5)
GLUCOSE: 94 mg/dL (ref 65–99)
Potassium: 4.8 mmol/L (ref 3.5–5.2)
Sodium: 143 mmol/L (ref 134–144)
Total Protein: 6.5 g/dL (ref 6.0–8.5)

## 2018-01-17 LAB — LIPID PANEL
CHOLESTEROL TOTAL: 169 mg/dL (ref 100–199)
Chol/HDL Ratio: 3.6 ratio (ref 0.0–4.4)
HDL: 47 mg/dL (ref >39)
LDL Calculated: 110 mg/dL — ABNORMAL HIGH (ref 0–99)
Triglycerides: 59 mg/dL (ref 0–149)
VLDL Cholesterol Cal: 12 mg/dL (ref 5–40)

## 2018-01-17 LAB — HEMOGLOBIN A1C
ESTIMATED AVERAGE GLUCOSE: 117 mg/dL
Hgb A1c MFr Bld: 5.7 % — ABNORMAL HIGH (ref 4.8–5.6)

## 2018-01-17 LAB — TSH: TSH: 1.93 u[IU]/mL (ref 0.450–4.500)

## 2018-01-31 ENCOUNTER — Encounter: Payer: BC Managed Care – PPO | Admitting: Obstetrics and Gynecology

## 2018-02-06 ENCOUNTER — Ambulatory Visit (INDEPENDENT_AMBULATORY_CARE_PROVIDER_SITE_OTHER): Payer: BC Managed Care – PPO | Admitting: Obstetrics and Gynecology

## 2018-02-06 ENCOUNTER — Encounter: Payer: Self-pay | Admitting: Obstetrics and Gynecology

## 2018-02-06 VITALS — BP 111/77 | HR 80 | Ht 63.0 in | Wt 203.5 lb

## 2018-02-06 DIAGNOSIS — E669 Obesity, unspecified: Secondary | ICD-10-CM | POA: Diagnosis not present

## 2018-02-06 MED ORDER — PHENTERMINE HCL 37.5 MG PO TABS: 37.5000 mg | ORAL_TABLET | Freq: Every day | ORAL | 2 refills | Status: DC

## 2018-02-06 NOTE — Patient Instructions (Signed)

## 2018-02-06 NOTE — Progress Notes (Signed)
Subjective:  Shannon Ross is a 47 y.o. G2P1002 at Unknown being seen today for weight loss management- initial visit.  Patient reports General ROS: positive for  - weight gain and reports previous weight loss attempts: Management changes made at the last visit include:  adding medication, stopping medication.  Onset was gradual  Over months ago, since starting Cymbalta..  Onset followed:  starting medication.   The patient has a surgical history of: thyroidectomy, gastrectomy, bariatric surgery, cholecystectomy and hysterectomy.  Past evaluation has included: metabolic profile, hemoglobin A1c, thyroid panel  and psychiatric evaluation.    Past treatment has included: small frequent feedings, nutritional supplement, vitamin supplement,  antidepressant, vitamin B-12 injections, appetite suppressant, exercise management and discontinuation of medication.  The following portions of the patient's history were reviewed and updated as appropriate: allergies, current medications, past family history, past medical history, past social history, past surgical history and problem list.   Objective:   Vitals:   02/06/18 1602  BP: 111/77  Pulse: 80  Weight: 203 lb 8 oz (92.3 kg)  Height:  (1.6 m)    General:  Alert, oriented and cooperative. Patient is in no acute distress.  :   :   :   :   :   :   PE: Well groomed female in no current distress,   Mental Status: Normal mood and affect. Normal behavior. Normal judgment and thought content.   Current BMI: Body mass index is 36.05 kg/m.   Assessment and Plan:  Obesity  There are no diagnoses linked to this encounter.  Plan: low carb, High protein diet RX for adipex 37.5 mg daily and B12 .ml monthly, to start now with first injection given at today's visit. Reviewed side-effects common to both medications and expected outcomes. Increase daily water intake to at least 8 bottle a day, every day.  Goal is to reduse weight  by 10% by end of three months, and will re-evaluate then.  RTC in 4 weeks for Nurse visit to check weight & BP, and get next B12 injections.    Please refer to After Visit Summary for other counseling recommendations.    [place], Melody N, CNM   Melody [place], CNM      Consider the Low Glycemic Index Diet and 6 smaller meals daily .  This boosts your metabolism and regulates your sugars:   Use the protein bar by [employer] because they have lots of fiber in them  Find the low carb flatbreads, tortillas and pita breads for sandwiches:  Joseph's makes a pita bread and a flat bread , available at [facility] and [employer]; Toufayah makes a low carb flatbread available at [employer] and HT that is 9 net carbs and 100 cal [place] makes a low carb whole wheat tortilla available at [employer] most grocery stores with 6 net carbs and 210 cal  [place] yogurt can still have a lot of carbs .  Dannon Light N fit has 80 cal and 8 carbs

## 2018-03-13 ENCOUNTER — Ambulatory Visit: Payer: BC Managed Care – PPO | Admitting: Obstetrics and Gynecology

## 2018-03-24 ENCOUNTER — Other Ambulatory Visit: Payer: Self-pay | Admitting: Obstetrics and Gynecology

## 2018-03-26 ENCOUNTER — Encounter: Payer: Self-pay | Admitting: Obstetrics and Gynecology

## 2018-03-26 ENCOUNTER — Ambulatory Visit (INDEPENDENT_AMBULATORY_CARE_PROVIDER_SITE_OTHER): Payer: BC Managed Care – PPO | Admitting: Obstetrics and Gynecology

## 2018-03-26 VITALS — BP 128/84 | HR 88 | Ht 63.0 in | Wt 197.6 lb

## 2018-03-26 DIAGNOSIS — Z30431 Encounter for routine checking of intrauterine contraceptive device: Secondary | ICD-10-CM | POA: Diagnosis not present

## 2018-03-26 NOTE — Progress Notes (Signed)
  Subjective:     Patient ID: Shannon Ross, female   DOB: 04/15/1971, 47 y.o.   MRN: [id number]  HPI Here for IUD check. Current IUD placed  08/17/18 without difficulty. Can't feel string but denies any concerns.  Review of Systems negative    Objective:   Physical Exam A&Ox4 Well groomed female in no distress Blood pressure 128/84, pulse 88, height 5\' 3"  (1.6 m), weight 197 lb 9.6 oz (89.6 kg). Body mass index is 35 kg/m.  Pelvic exam: normal external genitalia, vulva, vagina, cervix, uterus and adnexa, IUD string noted and laying off around cervix at nine o'clock..    Assessment:     IUD check     Plan:     Reassured of normal findings. RTC as needed.  Melody Shambley,CNM

## 2018-09-20 ENCOUNTER — Other Ambulatory Visit: Payer: Self-pay | Admitting: Obstetrics and Gynecology

## 2018-11-11 ENCOUNTER — Other Ambulatory Visit: Payer: Self-pay | Admitting: *Deleted

## 2018-11-11 MED ORDER — OMEPRAZOLE 40 MG PO CPDR: 40.0000 mg | DELAYED_RELEASE_CAPSULE | Freq: Two times a day (BID) | ORAL | 2 refills | Status: DC

## 2019-04-06 ENCOUNTER — Other Ambulatory Visit: Payer: Self-pay | Admitting: Obstetrics and Gynecology

## 2019-04-07 ENCOUNTER — Other Ambulatory Visit: Payer: Self-pay

## 2019-04-07 ENCOUNTER — Other Ambulatory Visit: Payer: Self-pay | Admitting: Obstetrics and Gynecology

## 2019-04-07 MED ORDER — OMEPRAZOLE 40 MG PO CPDR: 40.0000 mg | DELAYED_RELEASE_CAPSULE | Freq: Two times a day (BID) | ORAL | 0 refills | Status: DC

## 2019-04-09 ENCOUNTER — Other Ambulatory Visit: Payer: Self-pay

## 2019-04-09 MED ORDER — CYANOCOBALAMIN 1000 MCG/ML IJ SOLN: INTRAMUSCULAR | 3 refills | Status: DC

## 2019-05-13 ENCOUNTER — Other Ambulatory Visit: Payer: Self-pay | Admitting: Obstetrics and Gynecology

## 2019-05-20 ENCOUNTER — Other Ambulatory Visit (INDEPENDENT_AMBULATORY_CARE_PROVIDER_SITE_OTHER): Payer: BC Managed Care – PPO

## 2019-05-20 ENCOUNTER — Other Ambulatory Visit: Payer: Self-pay

## 2019-05-20 DIAGNOSIS — R300 Dysuria: Secondary | ICD-10-CM

## 2019-05-20 LAB — POCT URINALYSIS DIPSTICK
Glucose, UA: NEGATIVE
Ketones, UA: NEGATIVE
Nitrite, UA: NEGATIVE
Protein, UA: NEGATIVE
Spec Grav, UA: 1.015 (ref 1.010–1.025)
Urobilinogen, UA: 0.2 E.U./dL
pH, UA: 6.5 (ref 5.0–8.0)

## 2019-05-20 MED ORDER — NITROFURANTOIN MONOHYD MACRO 100 MG PO CAPS: 100.0000 mg | ORAL_CAPSULE | Freq: Two times a day (BID) | ORAL | 0 refills | Status: DC

## 2019-05-22 ENCOUNTER — Encounter: Payer: Self-pay | Admitting: *Deleted

## 2019-05-22 LAB — URINE CULTURE

## 2019-10-13 ENCOUNTER — Other Ambulatory Visit: Payer: Self-pay | Admitting: Internal Medicine

## 2019-10-13 DIAGNOSIS — Z1231 Encounter for screening mammogram for malignant neoplasm of breast: Secondary | ICD-10-CM

## 2019-10-20 ENCOUNTER — Other Ambulatory Visit (HOSPITAL_COMMUNITY)
Admission: RE | Admit: 2019-10-20 | Discharge: 2019-10-20 | Disposition: A | Discharge status: Home / Self Care | Payer: BC Managed Care – PPO | Source: Ambulatory Visit | Attending: Certified Nurse Midwife | Admitting: Certified Nurse Midwife

## 2019-10-20 ENCOUNTER — Ambulatory Visit: Payer: BC Managed Care – PPO | Admitting: Certified Nurse Midwife

## 2019-10-20 ENCOUNTER — Encounter: Payer: Self-pay | Admitting: Certified Nurse Midwife

## 2019-10-20 ENCOUNTER — Ambulatory Visit
Admission: RE | Admit: 2019-10-20 | Discharge: 2019-10-20 | Disposition: A | Discharge status: Home / Self Care | Payer: BC Managed Care – PPO | Source: Ambulatory Visit | Attending: Internal Medicine | Admitting: Internal Medicine

## 2019-10-20 ENCOUNTER — Other Ambulatory Visit: Payer: Self-pay

## 2019-10-20 VITALS — BP 130/66 | HR 78 | Ht 63.0 in | Wt 219.4 lb

## 2019-10-20 DIAGNOSIS — Z23 Encounter for immunization: Secondary | ICD-10-CM

## 2019-10-20 DIAGNOSIS — N898 Other specified noninflammatory disorders of vagina: Secondary | ICD-10-CM | POA: Insufficient documentation

## 2019-10-20 DIAGNOSIS — Z1231 Encounter for screening mammogram for malignant neoplasm of breast: Secondary | ICD-10-CM | POA: Diagnosis present

## 2019-10-20 DIAGNOSIS — R102 Pelvic and perineal pain: Secondary | ICD-10-CM | POA: Insufficient documentation

## 2019-10-20 DIAGNOSIS — R232 Flushing: Secondary | ICD-10-CM

## 2019-10-20 HISTORY — DX: Flushing: R23.2

## 2019-10-20 MED ORDER — TETANUS-DIPHTH-ACELL PERTUSSIS 5-2.5-18.5 LF-MCG/0.5 IM SUSP
0.5000 mL | Freq: Once | INTRAMUSCULAR | Status: AC
  Administered 2019-10-20: 0.5 mL via INTRAMUSCULAR

## 2019-10-20 NOTE — Progress Notes (Signed)
GYN ENCOUNTER NOTE  Subjective:       Shannon Ross is a 49 y.o. G66P1002 female is here for gynecologic evaluation of the following issues:  1. Hot flashes x 6 months 2-3 times a week, increased vaginal discharge with vaginal pain .States that is burns and feels like there is a tear. Hurts when she wipes. Pelvic pain      Gynecologic History No LMP recorded. (Menstrual status: IUD). Contraception: IUD Last Pap: 06/06/2017 Results were: normal, HPV negative Last mammogram:  10/20/19. Results were: has not been competed yet.  Obstetric History OB History  Gravida Para Term Preterm AB Living  2 1 1     2   SAB TAB Ectopic Multiple Live Births          2    # Outcome Date GA Lbr Len/2nd Weight Sex Delivery Anes PTL Lv  2 Term 2004    F Vag-Spont   LIV  1 Gravida 2004    M Vag-Spont   LIV    Past Medical History:  Diagnosis Date  . Abdominal pain, left lower quadrant   . Complication of anesthesia   . Cough due to bronchospasm   . Fatigue   . GERD (gastroesophageal reflux disease)   . Heartburn   . PONV (postoperative nausea and vomiting)   . Recurrent UTI     Past Surgical History:  Procedure Laterality Date  . BUNIONECTOMY    . KNEE ARTHROSCOPY Left   . LAPAROSCOPY N/A 08/16/2015   Procedure: LAPAROSCOPY OPERATIVE, RIGHT OVARIAN CYST ASPIRATION, FULGERATION/EXCISION OF ENDOMETRIOSIS/BIOPSIES;  Surgeon: Brayton Mars, MD;  Location: [facility] ORS;  Service: Gynecology;  Laterality: N/A;    Current Outpatient Medications on File Prior to Visit  Medication Sig Dispense Refill  . buPROPion (WELLBUTRIN XL) 150 MG 24 hr tablet     . Cholecalciferol 50 MCG (2000 UT) TABS Take by mouth.    . Cranberry 1000 MG CAPS Take by mouth.    . cyanocobalamin (,VITAMIN B-12,) 1000 MCG/ML injection INJECT 1 ML INTO THE MUSCLE EVERY 30 DAYS 10 mL 3  . levonorgestrel (MIRENA) 20 MCG/24HR IUD 1 each by Intrauterine route once.    Marland Kitchen omeprazole (PRILOSEC) 40 MG capsule TAKE 1 CAPSULE(40 MG)  BY MOUTH TWICE DAILY 60 capsule 0  . venlafaxine XR (EFFEXOR-XR) 75 MG 24 hr capsule Take by mouth.    . cetirizine (ZYRTEC) 10 MG tablet Take by mouth. Reported on 03/22/2016    . cholecalciferol (VITAMIN D) 1000 units tablet Take 1,000 Units by mouth daily.    . cyanocobalamin (,VITAMIN B-12,) 1000 MCG/ML injection INJECT 1 ML INTO THE MUSCLE EVERY 30 DAYS 10 mL 3  . diclofenac (VOLTAREN) 75 MG EC tablet Take 1 tablet (75 mg total) by mouth 2 (two) times daily as needed. (Patient not taking: Reported on 06/06/2016) 60 tablet 3  . DULoxetine (CYMBALTA) 60 MG capsule Take 60 mg by mouth daily.    . hydrochlorothiazide (HYDRODIURIL) 25 MG tablet TAKE 1 TABLET(25 MG) BY MOUTH DAILY (Patient not taking: Reported on 08/17/2017) 30 tablet 2  . metroNIDAZOLE (FLAGYL) 500 MG tablet Take 1 tablet (500 mg total) by mouth 2 (two) times daily. (Patient not taking: Reported on 08/17/2017) 14 tablet 0  . nitrofurantoin, macrocrystal-monohydrate, (MACROBID) 100 MG capsule Take 1 capsule (100 mg total) by mouth 2 (two) times daily. (Patient not taking: Reported on 06/06/2016) 14 capsule 0  . nitrofurantoin, macrocrystal-monohydrate, (MACROBID) 100 MG capsule Take 1 capsule (100 mg total) by  mouth 2 (two) times daily. 14 capsule 0  . omeprazole (PRILOSEC) 40 MG capsule TAKE 1 CAPSULE(40 MG) BY MOUTH TWICE DAILY 60 capsule 2  . phentermine (ADIPEX-P) 37.5 MG tablet Take 1 tablet (37.5 mg total) by mouth daily before breakfast. 30 tablet 2  . Vitamin D, Ergocalciferol, (DRISDOL) 50000 units CAPS capsule TAKE 1 CAPSULE BY MOUTH TWICE A WEEK (Patient not taking: Reported on 06/06/2017) 24 capsule 1   No current facility-administered medications on file prior to visit.    Allergies  Allergen Reactions  . Amoxicillin-Pot Clavulanate Anaphylaxis, Hives, Itching and Swelling  . Sulfa Antibiotics Swelling    Tongue swelling     Social History   Socioeconomic History  . Marital status: Married    Spouse name: Not on  file  . Number of children: Not on file  . Years of education: Not on file  . Highest education level: Not on file  Occupational History  . Not on file  Tobacco Use  . Smoking status: Never Smoker  . Smokeless tobacco: Never Used  Substance and Sexual Activity  . Alcohol use: No  . Drug use: No  . Sexual activity: Yes    Birth control/protection: I.U.D.    Comment: mirena  Other Topics Concern  . Not on file  Social History Narrative  . Not on file   Social Determinants of Health   Financial Resource Strain:   . Difficulty of Paying Living Expenses: Not on file  Food Insecurity:   . Worried About Programme researcher, broadcasting/film/video in the Last Year: Not on file  . Ran Out of Food in the Last Year: Not on file  Transportation Needs:   . Lack of Transportation (Medical): Not on file  . Lack of Transportation (Non-Medical): Not on file  Physical Activity:   . Days of Exercise per Week: Not on file  . Minutes of Exercise per Session: Not on file  Stress:   . Feeling of Stress : Not on file  Social Connections:   . Frequency of Communication with Friends and Family: Not on file  . Frequency of Social Gatherings with Friends and Family: Not on file  . Attends Religious Services: Not on file  . Active Member of Clubs or Organizations: Not on file  . Attends Banker Meetings: Not on file  . Marital Status: Not on file  Intimate Partner Violence:   . Fear of Current or Ex-Partner: Not on file  . Emotionally Abused: Not on file  . Physically Abused: Not on file  . Sexually Abused: Not on file    Family History  Problem Relation Age of Onset  . Diabetes Father   . Aneurysm Mother   . Kidney disease Neg Hx   . Bladder Cancer Neg Hx     The following portions of the patient's history were reviewed and updated as appropriate: allergies, current medications, past family history, past medical history, past social history, past surgical history and problem list.  Review of  Systems Review of Systems - Negative except as mentioned in HPI Review of Systems - General ROS: negative for - chills, fatigue, fever, hot flashes, malaise or night sweats Hematological and Lymphatic ROS: negative for - bleeding problems or swollen lymph nodes Gastrointestinal ROS: negative for - abdominal pain, blood in stools, change in bowel habits and nausea/vomiting Musculoskeletal ROS: negative for - joint pain, muscle pain or muscular weakness Genito-Urinary ROS: negative for - change in menstrual cycle, dysmenorrhea, , dysuria, genital  discharge, genital ulcers, hematuria, incontinence, irregular/heavy menses, nocturia . Positive for pelvic pain, dyspareunia, burning pain.  Objective:   BP 130/66   Pulse 78   Ht 5\' 3"  (1.6 m)   Wt 219 lb 6 oz (99.5 kg)   BMI 38.86 kg/m  CONSTITUTIONAL: Well-developed, well-nourished female in no acute distress.  HENT:  Normocephalic, atraumatic.  NECK: Normal range of motion, supple, no masses.  Normal thyroid.  SKIN: Skin is warm and dry. No rash noted. Not diaphoretic. No erythema. No pallor. NEUROLGIC: Alert and oriented to person, place, and time. PSYCHIATRIC: Normal mood and affect. Normal behavior. Normal judgment and thought content. CARDIOVASCULAR:Not Examined RESPIRATORY: Not Examined BREASTS: Not Examined ABDOMEN: Soft, non distended; Non tender.  No Organomegaly. PELVIC:  External Genitalia: Normal  BUS: Normal  Vagina:  redness noted, tender with placement of speculum, yellow discharge-swab collected   Cervix: Normal, cervical motion tenderness, did not see strings   Uterus: Normal size, shape,consistency, mobile, tenderness, more on her left side  Adnexa: Normal  RV: Normal   Bladder: Nontender MUSCULOSKELETAL: Normal range of motion. No tenderness.  No cyanosis, clubbing, or edema.     Assessment:   1. Vaginal discharge - Cervicovaginal ancillary only  2. Pelvic pain - PELVIC COMPLETE WITH TRANSVAGINAL; Future      Plan:   Discussed signs and symptoms of menopause. Discussed treatment option of HT. Local vis systemic treatment for her symptoms. Information given on hot flashed and self help measures. Discussed systemic treatment for hot flashes. She state that she is managing these symptoms ok at this time and will try some of the self help measures. Discussed vulvovaginal atrophy and treatment options. Discussed localized estrogen cream (primarin). Given that the tissue is inflamed recommend treatment of infections first. Swb collected.. U/s for pelvic pain and IUD placement, then treatment with the premarin cream. She is in agreement of plan. Will follow up with results.   [place], CNM

## 2019-10-20 NOTE — Patient Instructions (Signed)
Menopause and Hormone Replacement Therapy Menopause is a normal time of life when menstrual periods stop completely and the ovaries stop producing the female hormones estrogen and progesterone. This lack of hormones can affect your health and cause undesirable symptoms. Hormone replacement therapy (HRT) can relieve some of those symptoms. What is hormone replacement therapy? HRT is the use of artificial (synthetic) hormones to replace hormones that your body has stopped producing because you have reached menopause. What are my options for HRT?  HRT may consist of the synthetic hormones estrogen and progestin, or it may consist of only estrogen (estrogen-only therapy). You and your health care provider will decide which form of HRT is best for you. If you choose to be on HRT and you have a uterus, estrogen and progestin are usually prescribed. Estrogen-only therapy is used for women who do not have a uterus. Possible options for taking HRT include:  Pills.  Patches.  Gels.  Sprays.  Vaginal cream.  Vaginal rings.  Vaginal inserts. The amount of hormone(s) that you take and how long you take the hormone(s) varies according to your health. It is important to:  Begin HRT with the lowest possible dosage.  Stop HRT as soon as your health care provider tells you to stop.  Work with your health care provider so that you feel informed and comfortable with your decisions. What are the benefits of HRT? HRT can reduce the frequency and severity of menopausal symptoms. Benefits of HRT vary according to the kind of symptoms that you have, how severe they are, and your overall health. HRT may help to improve the following symptoms of menopause:  Hot flashes and night sweats. These are sudden feelings of heat that spread over the face and body. The skin may turn red, like a blush. Night sweats are hot flashes that happen while you are sleeping or trying to sleep.  Bone loss (osteoporosis). The  body loses calcium more quickly after menopause, causing the bones to become weaker. This can increase the risk for bone breaks (fractures).  Vaginal dryness. The lining of the vagina can become thin and dry, which can cause pain during sex or cause infection, burning, or itching.  Urinary tract infections.  Urinary incontinence. This is the inability to control when you pass urine.  Irritability.  Short-term memory problems. What are the risks of HRT? Risks of HRT vary depending on your individual health and medical history. Risks of HRT also depend on whether you receive both estrogen and progestin or you receive estrogen only. HRT may increase the risk of:  Spotting. This is when a small amount of blood leaks from the vagina unexpectedly.  Endometrial cancer. This cancer is in the lining of the uterus (endometrium).  Breast cancer.  Increased density of breast tissue. This can make it harder to find breast cancer on a breast X-ray (mammogram).  Stroke.  Heart disease.  Blood clots.  Gallbladder disease.  Liver disease. Risks of HRT can increase if you have any of the following conditions:  Endometrial cancer.  Liver disease.  Heart disease.  Breast cancer.  History of blood clots.  History of stroke. Follow these instructions at home:  Take over-the-counter and prescription medicines only as told by your health care provider.  Get mammograms, pelvic exams, and medical checkups as often as told by your health care provider.  Have Pap tests done as often as told by your health care provider. A Pap test is sometimes called a Pap smear. It   is a screening test that is used to check for signs of cancer of the cervix and vagina. A Pap test can also identify the presence of infection or precancerous changes. Pap tests may be done: ? Every 3 years, starting at age 21. ? Every 5 years, starting after age 30, in combination with testing for human papillomavirus  (HPV). ? More often or less often depending on other medical conditions you have, your age, and other risk factors.  It is up to you to get the results of your Pap test. Ask your health care provider, or the department that is doing the test, when your results will be ready.  Keep all follow-up visits as told by your health care provider. This is important. Contact a health care provider if you have:  Pain or swelling in your legs.  Shortness of breath.  Chest pain.  Lumps or changes in your breasts or armpits.  Slurred speech.  Pain, burning, or bleeding when you urinate.  Unusual vaginal bleeding.  Dizziness or headaches.  Weakness or numbness in any part of your arms or legs.  Pain in your abdomen. Summary  Menopause is a normal time of life when menstrual periods stop completely and the ovaries stop producing the female hormones estrogen and progesterone.  Hormone replacement therapy (HRT) can relieve some of the symptoms of menopause.  HRT can reduce the frequency and severity of menopausal symptoms.  Risks of HRT vary depending on your individual health and medical history. This information is not intended to replace advice given to you by your health care provider. Make sure you discuss any questions you have with your health care provider. Document Revised: 05/07/2018 Document Reviewed: 05/07/2018 Elsevier Patient Education  2020 [employer]vier Inc.  

## 2019-10-21 ENCOUNTER — Other Ambulatory Visit: Payer: Self-pay | Admitting: Certified Nurse Midwife

## 2019-10-21 LAB — CERVICOVAGINAL ANCILLARY ONLY
Bacterial Vaginitis (gardnerella): POSITIVE — AB
Candida Glabrata: NEGATIVE
Candida Vaginitis: NEGATIVE
Chlamydia: NEGATIVE
Comment: NEGATIVE
Comment: NEGATIVE
Comment: NEGATIVE
Comment: NEGATIVE
Comment: NEGATIVE
Comment: NORMAL
Neisseria Gonorrhea: NEGATIVE
Trichomonas: POSITIVE — AB

## 2019-10-21 MED ORDER — METRONIDAZOLE 500 MG PO TABS: 500.0000 mg | ORAL_TABLET | Freq: Two times a day (BID) | ORAL | 0 refills | Status: AC

## 2019-10-21 NOTE — Progress Notes (Signed)
Vaginal swab positive for BV and trichomoniasis . Orders placed for treatment. Pt notified via my chart message.   Doreene Burke, CNM

## 2019-10-22 ENCOUNTER — Telehealth: Payer: Self-pay

## 2019-10-22 ENCOUNTER — Telehealth: Payer: Self-pay | Admitting: Certified Nurse Midwife

## 2019-10-22 ENCOUNTER — Other Ambulatory Visit: Payer: Self-pay

## 2019-10-22 ENCOUNTER — Ambulatory Visit (INDEPENDENT_AMBULATORY_CARE_PROVIDER_SITE_OTHER): Payer: BC Managed Care – PPO

## 2019-10-22 DIAGNOSIS — Z975 Presence of (intrauterine) contraceptive device: Secondary | ICD-10-CM

## 2019-10-22 DIAGNOSIS — R102 Pelvic and perineal pain: Secondary | ICD-10-CM | POA: Diagnosis not present

## 2019-10-22 DIAGNOSIS — N898 Other specified noninflammatory disorders of vagina: Secondary | ICD-10-CM

## 2019-10-22 NOTE — Telephone Encounter (Signed)
The pharmacy called in and the pts med ( flagyl) they needs to clarify the instructions.  The pharmacy is requesting a call back.  Shoe [phone]

## 2019-10-22 NOTE — Telephone Encounter (Signed)
Yes she said her name is shoe

## 2019-10-22 NOTE — Telephone Encounter (Signed)
mychart message sent to patient

## 2019-10-27 ENCOUNTER — Other Ambulatory Visit: Payer: Self-pay

## 2019-10-27 MED ORDER — OMEPRAZOLE 40 MG PO CPDR: DELAYED_RELEASE_CAPSULE | ORAL | 0 refills | Status: DC

## 2019-11-04 ENCOUNTER — Encounter: Payer: BC Managed Care – PPO | Admitting: Certified Nurse Midwife

## 2019-12-03 ENCOUNTER — Other Ambulatory Visit: Payer: Self-pay | Admitting: Certified Nurse Midwife

## 2019-12-10 ENCOUNTER — Encounter: Payer: BC Managed Care – PPO | Admitting: Certified Nurse Midwife

## 2020-01-14 ENCOUNTER — Other Ambulatory Visit: Payer: Self-pay | Admitting: Internal Medicine

## 2020-01-14 ENCOUNTER — Ambulatory Visit
Admission: RE | Admit: 2020-01-14 | Discharge: 2020-01-14 | Disposition: A | Discharge status: Home / Self Care | Payer: BC Managed Care – PPO | Source: Ambulatory Visit | Attending: Internal Medicine | Admitting: Internal Medicine

## 2020-01-14 ENCOUNTER — Other Ambulatory Visit: Payer: Self-pay

## 2020-01-14 DIAGNOSIS — I82621 Acute embolism and thrombosis of deep veins of right upper extremity: Secondary | ICD-10-CM

## 2020-01-14 DIAGNOSIS — M7989 Other specified soft tissue disorders: Secondary | ICD-10-CM

## 2020-01-14 DIAGNOSIS — M79601 Pain in right arm: Secondary | ICD-10-CM | POA: Diagnosis present

## 2020-01-21 ENCOUNTER — Other Ambulatory Visit: Payer: Self-pay | Admitting: Certified Nurse Midwife

## 2020-02-18 ENCOUNTER — Other Ambulatory Visit: Payer: Self-pay | Admitting: Internal Medicine

## 2020-02-20 ENCOUNTER — Other Ambulatory Visit: Payer: Self-pay | Admitting: Internal Medicine

## 2020-02-20 ENCOUNTER — Other Ambulatory Visit (HOSPITAL_COMMUNITY): Payer: Self-pay | Admitting: Internal Medicine

## 2020-02-20 DIAGNOSIS — R2231 Localized swelling, mass and lump, right upper limb: Secondary | ICD-10-CM

## 2020-03-03 ENCOUNTER — Other Ambulatory Visit: Payer: Self-pay

## 2020-03-03 ENCOUNTER — Ambulatory Visit
Admission: RE | Admit: 2020-03-03 | Discharge: 2020-03-03 | Disposition: A | Discharge status: Home / Self Care | Payer: BC Managed Care – PPO | Source: Ambulatory Visit | Attending: Internal Medicine | Admitting: Internal Medicine

## 2020-03-03 DIAGNOSIS — R2231 Localized swelling, mass and lump, right upper limb: Secondary | ICD-10-CM | POA: Diagnosis present

## 2020-03-03 MED ORDER — GADOBUTROL 1 MMOL/ML IV SOLN
9.0000 mL | Freq: Once | INTRAVENOUS | Status: AC | PRN
  Administered 2020-03-03: 9 mL via INTRAVENOUS

## 2021-01-20 IMAGING — MG DIGITAL SCREENING BILAT W/ TOMO W/ CAD
8 series · 8 of 24 positions shown · non-contrast
Comparison: Previous exam(s).

CLINICAL DATA: Screening.

EXAM:
DIGITAL SCREENING BILATERAL MAMMOGRAM WITH TOMO AND CAD

[R CC synth-2D]
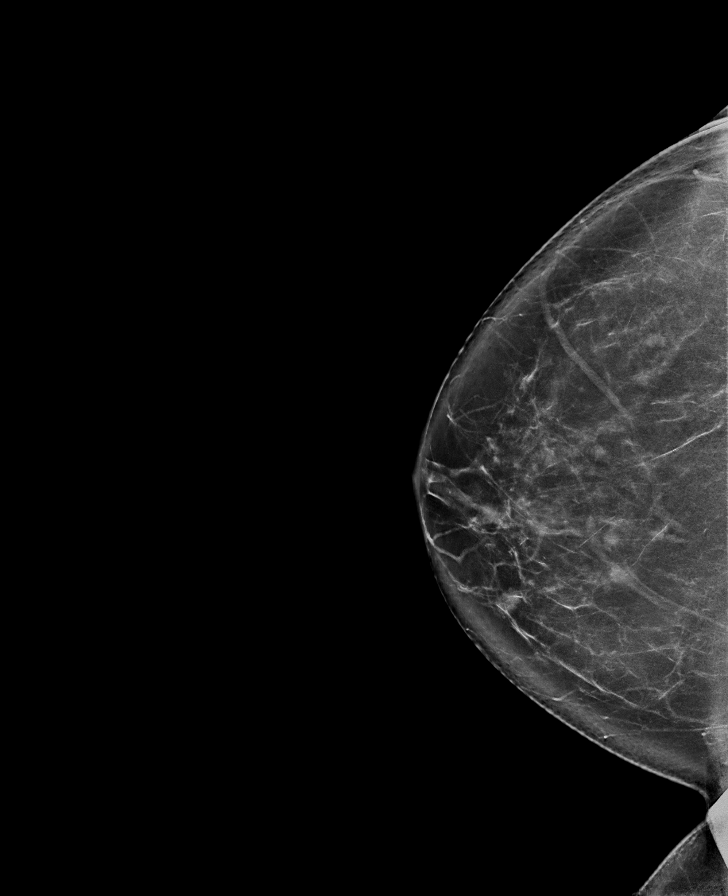

[L MLO synth-2D]
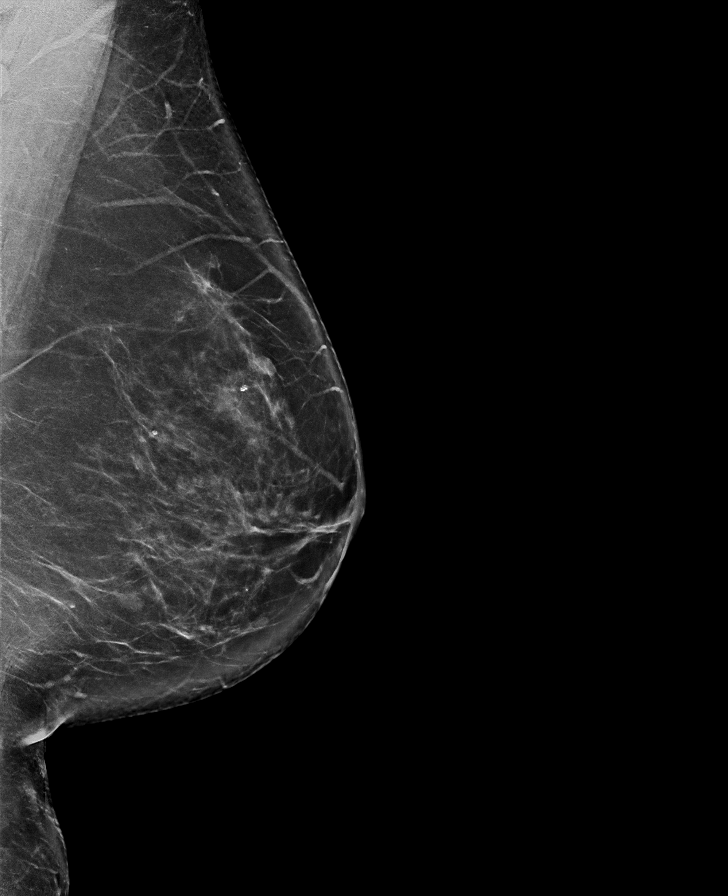

[L CC synth-2D]
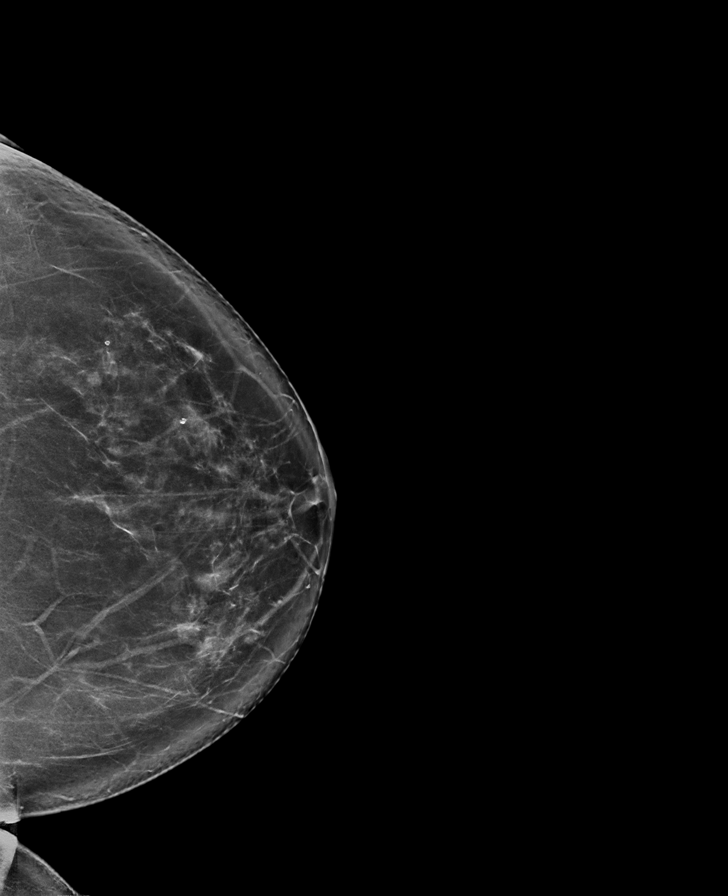

[R MLO synth-2D]
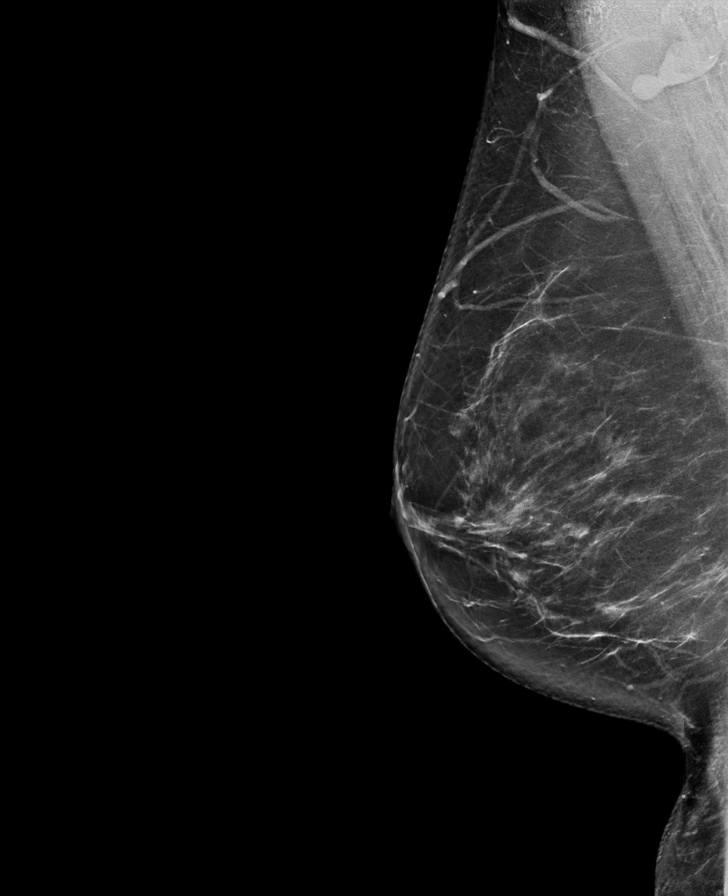

[R CC tomo · tomo slice 45/88.0]
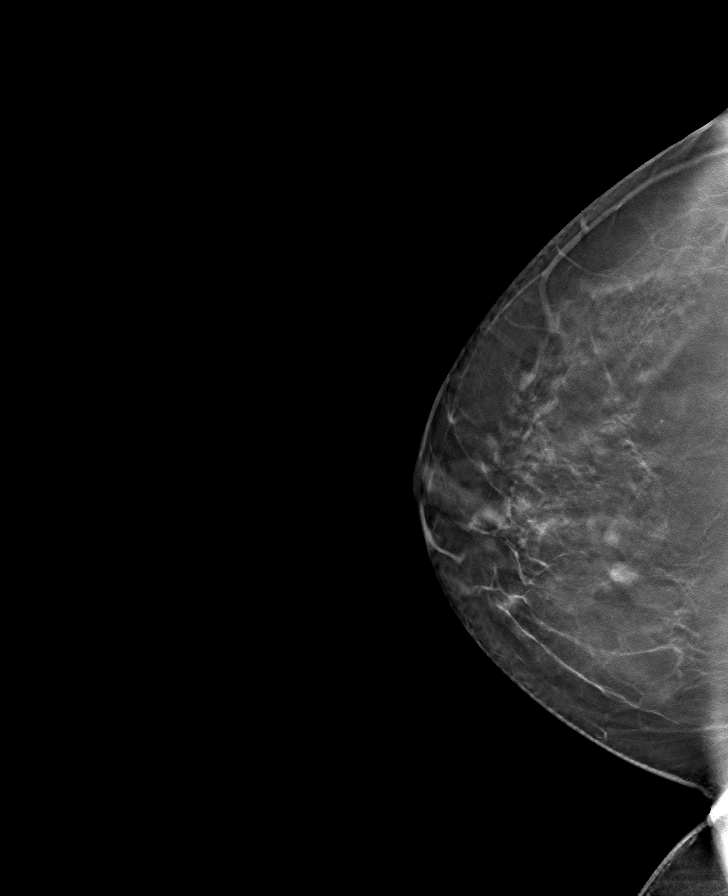

[L CC tomo · tomo slice 45/88.0]
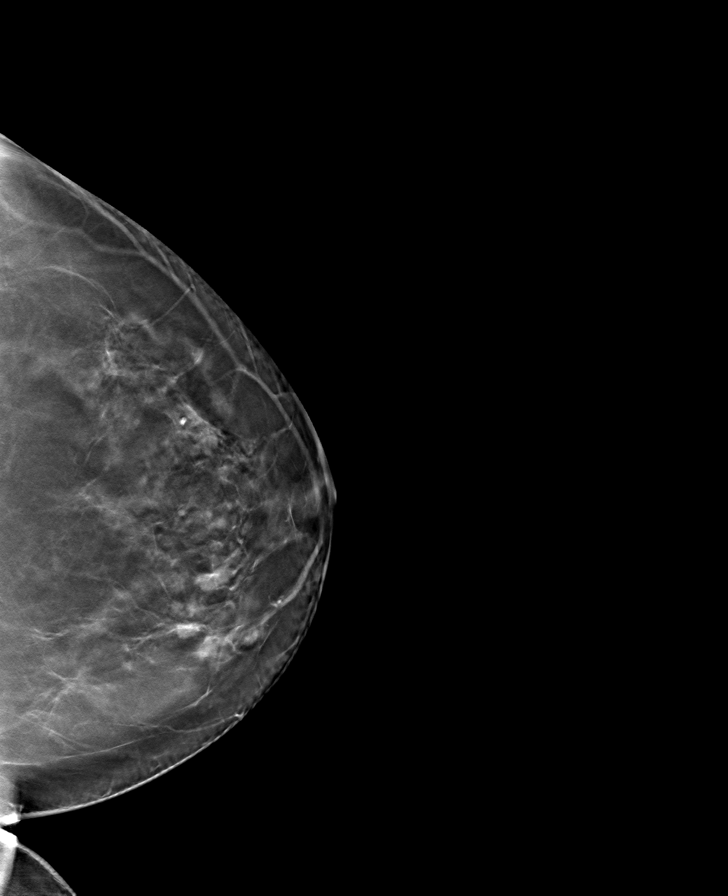

[L MLO tomo · tomo slice 47/93.0]
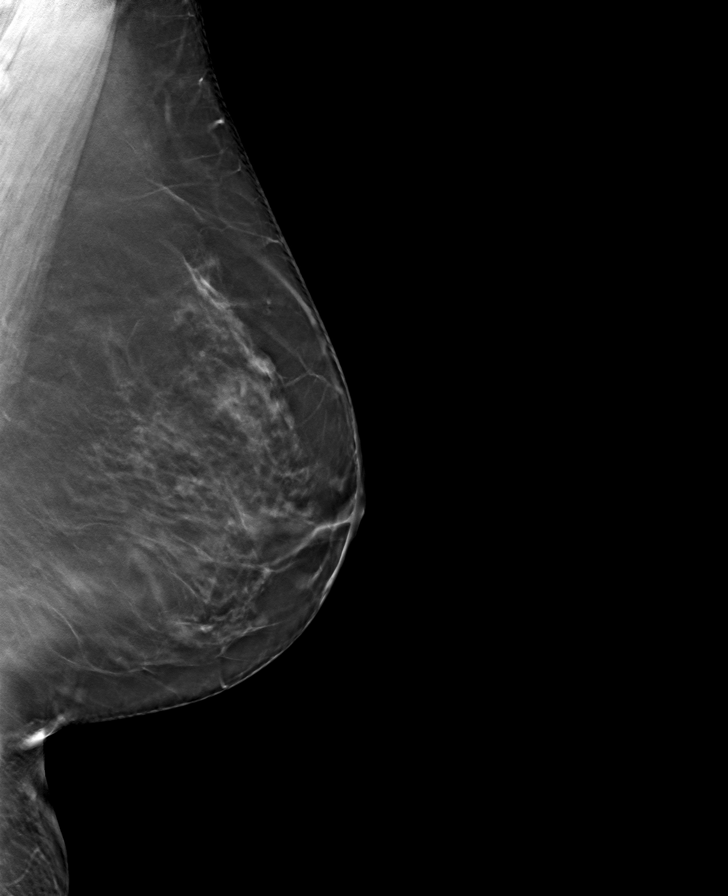

[R MLO tomo · tomo slice 47/92.0]
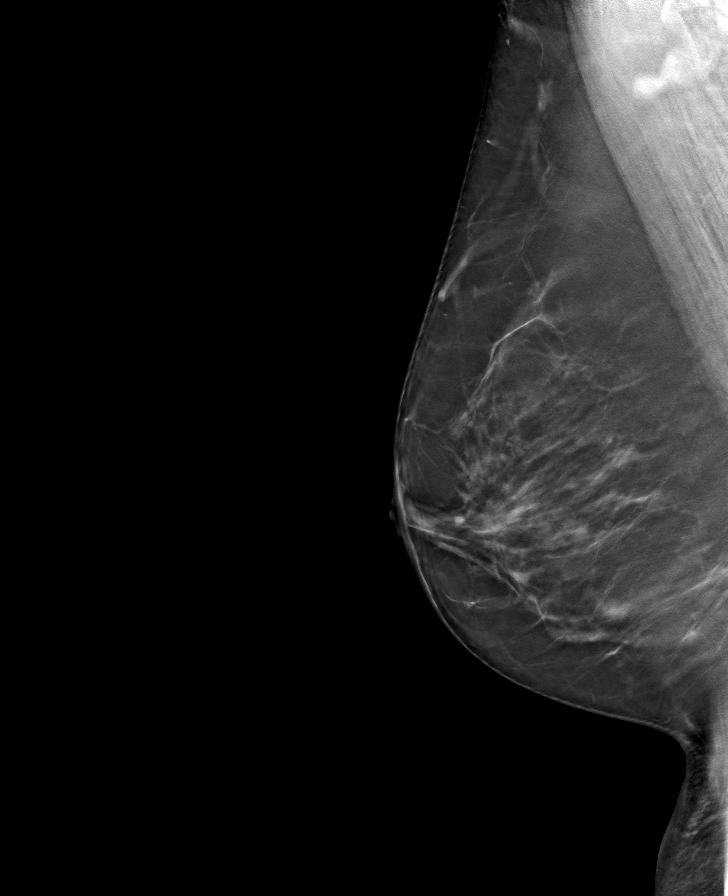

[8 of 24 positions shown; findings below may reference images not displayed]

ACR Breast Density Category b: There are scattered areas of
fibroglandular density.
FINDINGS: There are no findings suspicious for malignancy. Images were
processed with CAD.
IMPRESSION: No mammographic evidence of malignancy. A result letter of this
screening mammogram will be mailed directly to the patient.

RECOMMENDATION:
Screening mammogram in one year. (Code:[id number])

BI-RADS CATEGORY  1: Negative.

## 2021-08-18 DIAGNOSIS — J40 Bronchitis, not specified as acute or chronic: Secondary | ICD-10-CM

## 2021-08-18 HISTORY — DX: Bronchitis, not specified as acute or chronic: J40

## 2021-09-27 ENCOUNTER — Other Ambulatory Visit: Payer: Self-pay | Admitting: Podiatry

## 2021-10-12 ENCOUNTER — Encounter
Admission: RE | Admit: 2021-10-12 | Discharge: 2021-10-12 | Disposition: A | Discharge status: Home / Self Care | Payer: BC Managed Care – PPO | Source: Ambulatory Visit | Attending: Podiatry | Admitting: Podiatry

## 2021-10-12 ENCOUNTER — Other Ambulatory Visit: Payer: Self-pay

## 2021-10-12 HISTORY — DX: Endometriosis, unspecified: N80.9

## 2021-10-12 HISTORY — DX: Unspecified ovarian cyst, unspecified side: N83.209

## 2021-10-12 HISTORY — DX: Family history of other specified conditions: Z84.89

## 2021-10-12 HISTORY — DX: Headache, unspecified: R51.9

## 2021-10-12 HISTORY — DX: Sleep apnea, unspecified: G47.30

## 2021-10-12 HISTORY — DX: Fibromyalgia: M79.7

## 2021-10-12 HISTORY — DX: Essential (primary) hypertension: I10

## 2021-10-12 HISTORY — DX: Personal history of urinary calculi: Z87.442

## 2021-10-12 NOTE — Patient Instructions (Signed)
Your procedure is scheduled on:10-21-21 Friday Report to the Registration Desk on the 1st floor of the [facility].Then proceed to the 2nd floor Surgery Desk in the [facility] To find out your arrival time, please call [phone] between 1PM - 3PM on:10-20-21 Thursday  REMEMBER: Instructions that are not followed completely may result in serious medical risk, up to and including death; or upon the discretion of your surgeon and anesthesiologist your surgery may need to be rescheduled.  Do not eat food after midnight the night before surgery.  No gum chewing, lozengers or hard candies.  You may however, drink CLEAR liquids up to 2 hours before you are scheduled to arrive for your surgery. Do not drink anything within 2 hours of your scheduled arrival time.  Clear liquids include: - water  - apple juice without pulp - gatorade (not RED, PURPLE, OR BLUE) - black coffee or tea (Do NOT add milk or creamers to the coffee or tea) Do NOT drink anything that is not on this list.  In addition, your doctor has ordered for you to drink the provided  Ensure Pre-Surgery Clear Carbohydrate Drink Drinking this carbohydrate drink up to two hours before surgery helps to reduce insulin resistance and improve patient outcomes. Please complete drinking 2 hours prior to scheduled arrival time.  TAKE THESE MEDICATIONS THE MORNING OF SURGERY WITH A SIP OF WATER: -buPROPion (WELLBUTRIN SR) -venlafaxine (EFFEXOR) -omeprazole (PRILOSEC)-Take one the night before surgery and another one the morning of surgery  Bring your Albuterol Inhaler to the hospital the day of surgery  One week prior to surgery: Stop Anti-inflammatories (NSAIDS) such as Advil, Aleve, Ibuprofen, Motrin, Naproxen, Naprosyn and Aspirin based products such as Excedrin, Goodys Powder, BC Powder.You may however, take Tylenol if needed for pain up until the day of surgery.  Stop ANY OVER THE COUNTER supplements/vitamins 7 days prior to surgery  (10-13-21)   No Alcohol for 24 hours before or after surgery.  No Smoking including e-cigarettes for 24 hours prior to surgery.  No chewable tobacco products for at least 6 hours prior to surgery.  No nicotine patches on the day of surgery.  Do not use any "recreational" drugs for at least a week prior to your surgery.  Please be advised that the combination of cocaine and anesthesia may have negative outcomes, up to and including death. If you test positive for cocaine, your surgery will be cancelled.  On the morning of surgery brush your teeth with toothpaste and water, you may rinse your mouth with mouthwash if you wish. Do not swallow any toothpaste or mouthwash.  Use CHG Soap as directed on instruction sheet.  Do not wear jewelry, make-up, hairpins, clips or nail polish.  Do not wear lotions, powders, or perfumes.   Do not shave body from the neck down 48 hours prior to surgery just in case you cut yourself which could leave a site for infection.  Also, freshly shaved skin may become irritated if using the CHG soap.  Contact lenses, hearing aids and dentures may not be worn into surgery.  Do not bring valuables to the hospital. [facility] is not responsible for any missing/lost belongings or valuables.   Bring your C-PAP to the hospital with you   Notify your doctor if there is any change in your medical condition (cold, fever, infection).  Wear comfortable clothing (specific to your surgery type) to the hospital.  After surgery, you can help prevent lung complications by doing breathing exercises.  Take deep breaths and cough every 1-2 hours. Your doctor may order a device called an Incentive Spirometer to help you take deep breaths. When coughing or sneezing, hold a pillow firmly against your incision with both hands. This is called splinting. Doing this helps protect your incision. It also decreases belly discomfort.  If you are being admitted to the hospital  overnight, leave your suitcase in the car. After surgery it may be brought to your room.  If you are being discharged the day of surgery, you will not be allowed to drive home. You will need a responsible adult (18 years or older) to drive you home and stay with you that night.   If you are taking public transportation, you will need to have a responsible adult (18 years or older) with you. Please confirm with your physician that it is acceptable to use public transportation.   Please call the [facility] at [phone] if you have any questions about these instructions.  Surgery Visitation Policy:  Patients undergoing a surgery or procedure may have one family member or support person with them as long as that person is not COVID-19 positive or experiencing its symptoms.  That person may remain in the waiting area during the procedure and may rotate out with other people.  Inpatient Visitation:    Visiting hours are 7 a.m. to 8 p.m. Up to two visitors ages 16+ are allowed at one time in a patient room. The visitors may rotate out with other people during the day. Visitors must check out when they leave, or other visitors will not be allowed. One designated support person may remain overnight. The visitor must pass COVID-19 screenings, use hand sanitizer when entering and exiting the patients room and wear a mask at all times, including in the patients room. Patients must also wear a mask when staff or their visitor are in the room. Masking is required regardless of vaccination status.

## 2021-10-14 ENCOUNTER — Other Ambulatory Visit: Payer: BC Managed Care – PPO

## 2021-10-21 ENCOUNTER — Ambulatory Visit: Payer: BC Managed Care – PPO | Admitting: Urgent Care

## 2021-10-21 ENCOUNTER — Encounter: Payer: Self-pay | Admitting: Podiatry

## 2021-10-21 ENCOUNTER — Ambulatory Visit
Admission: RE | Admit: 2021-10-21 | Discharge: 2021-10-21 | Disposition: A | Discharge status: Home / Self Care | Payer: BC Managed Care – PPO | Attending: Podiatry | Admitting: Podiatry

## 2021-10-21 ENCOUNTER — Other Ambulatory Visit: Payer: Self-pay

## 2021-10-21 ENCOUNTER — Encounter
Admission: RE | Disposition: A | Discharge status: Home / Self Care | Payer: Self-pay | Source: Home / Self Care | Attending: Podiatry

## 2021-10-21 ENCOUNTER — Ambulatory Visit: Payer: BC Managed Care – PPO

## 2021-10-21 DIAGNOSIS — M899 Disorder of bone, unspecified: Secondary | ICD-10-CM | POA: Diagnosis not present

## 2021-10-21 DIAGNOSIS — G4730 Sleep apnea, unspecified: Secondary | ICD-10-CM | POA: Diagnosis not present

## 2021-10-21 DIAGNOSIS — M7661 Achilles tendinitis, right leg: Secondary | ICD-10-CM | POA: Insufficient documentation

## 2021-10-21 DIAGNOSIS — E669 Obesity, unspecified: Secondary | ICD-10-CM | POA: Diagnosis not present

## 2021-10-21 HISTORY — PX: OSTECTOMY: SHX6439

## 2021-10-21 HISTORY — PX: ACHILLES TENDON SURGERY: SHX542

## 2021-10-21 LAB — POCT PREGNANCY, URINE: Preg Test, Ur: NEGATIVE

## 2021-10-21 SURGERY — REPAIR, TENDON, ACHILLES
Anesthesia: General | Site: Heel | Laterality: Right

## 2021-10-21 MED ORDER — SUGAMMADEX SODIUM 200 MG/2ML IV SOLN
INTRAVENOUS | Status: DC | PRN
  Administered 2021-10-21: 200 mg via INTRAVENOUS

## 2021-10-21 MED ORDER — OXYCODONE HCL 5 MG PO TABS
ORAL_TABLET | ORAL | Status: AC
  Filled 2021-10-21: qty 1

## 2021-10-21 MED ORDER — ONDANSETRON HCL 4 MG/2ML IJ SOLN
INTRAMUSCULAR | Status: DC | PRN
  Administered 2021-10-21: 4 mg via INTRAVENOUS

## 2021-10-21 MED ORDER — CHLORHEXIDINE GLUCONATE 0.12 % MT SOLN
15.0000 mL | Freq: Once | OROMUCOSAL | Status: AC
  Administered 2021-10-21: 15 mL via OROMUCOSAL

## 2021-10-21 MED ORDER — PHENYLEPHRINE HCL (PRESSORS) 10 MG/ML IV SOLN
INTRAVENOUS | Status: DC | PRN
Start: 2021-10-21 — End: 2021-10-21
  Administered 2021-10-21 (×3): 100 ug via INTRAVENOUS

## 2021-10-21 MED ORDER — APREPITANT 40 MG PO CAPS
40.0000 mg | ORAL_CAPSULE | Freq: Once | ORAL | Status: AC
  Administered 2021-10-21: 40 mg via ORAL

## 2021-10-21 MED ORDER — ONDANSETRON HCL 4 MG/2ML IJ SOLN: 4.0000 mg | Freq: Four times a day (QID) | INTRAMUSCULAR | Status: DC | PRN

## 2021-10-21 MED ORDER — ORAL CARE MOUTH RINSE: 15.0000 mL | Freq: Once | OROMUCOSAL | Status: AC

## 2021-10-21 MED ORDER — 0.9 % SODIUM CHLORIDE (POUR BTL) OPTIME
TOPICAL | Status: DC | PRN
  Administered 2021-10-21 (×2): 500 mL

## 2021-10-21 MED ORDER — PROMETHAZINE HCL 25 MG/ML IJ SOLN: 6.2500 mg | INTRAMUSCULAR | Status: DC | PRN

## 2021-10-21 MED ORDER — PROPOFOL 10 MG/ML IV BOLUS
INTRAVENOUS | Status: DC | PRN
  Administered 2021-10-21: 200 mg via INTRAVENOUS

## 2021-10-21 MED ORDER — SODIUM CHLORIDE 0.9 % IR SOLN
Status: DC | PRN
  Administered 2021-10-21: 100 mL

## 2021-10-21 MED ORDER — ONDANSETRON HCL 4 MG/2ML IJ SOLN
INTRAMUSCULAR | Status: AC
  Filled 2021-10-21: qty 2

## 2021-10-21 MED ORDER — MIDAZOLAM HCL 2 MG/2ML IJ SOLN
INTRAMUSCULAR | Status: AC
  Filled 2021-10-21: qty 2

## 2021-10-21 MED ORDER — OXYCODONE-ACETAMINOPHEN 5-325 MG PO TABS: 1.0000 | ORAL_TABLET | Freq: Four times a day (QID) | ORAL | 0 refills | Status: DC | PRN

## 2021-10-21 MED ORDER — FENTANYL CITRATE (PF) 100 MCG/2ML IJ SOLN
INTRAMUSCULAR | Status: AC
  Filled 2021-10-21: qty 2

## 2021-10-21 MED ORDER — ROCURONIUM BROMIDE 100 MG/10ML IV SOLN
INTRAVENOUS | Status: DC | PRN
  Administered 2021-10-21: 65 mg via INTRAVENOUS

## 2021-10-21 MED ORDER — ACETAMINOPHEN 10 MG/ML IV SOLN
INTRAVENOUS | Status: AC
  Filled 2021-10-21: qty 100

## 2021-10-21 MED ORDER — BUPIVACAINE-EPINEPHRINE 0.25% -1:200000 IJ SOLN
INTRAMUSCULAR | Status: DC | PRN
  Administered 2021-10-21: 10 mL

## 2021-10-21 MED ORDER — MIDAZOLAM HCL 2 MG/2ML IJ SOLN
INTRAMUSCULAR | Status: DC | PRN
  Administered 2021-10-21: 2 mg via INTRAVENOUS

## 2021-10-21 MED ORDER — CLINDAMYCIN PHOSPHATE 900 MG/50ML IV SOLN
INTRAVENOUS | Status: AC
  Filled 2021-10-21: qty 50

## 2021-10-21 MED ORDER — DEXAMETHASONE SODIUM PHOSPHATE 10 MG/ML IJ SOLN
INTRAMUSCULAR | Status: DC | PRN
  Administered 2021-10-21: 5 mg via INTRAVENOUS

## 2021-10-21 MED ORDER — PHENYLEPHRINE HCL (PRESSORS) 10 MG/ML IV SOLN
INTRAVENOUS | Status: AC
  Filled 2021-10-21: qty 1

## 2021-10-21 MED ORDER — APREPITANT 40 MG PO CAPS
ORAL_CAPSULE | ORAL | Status: AC
  Filled 2021-10-21: qty 1

## 2021-10-21 MED ORDER — CHLORHEXIDINE GLUCONATE 0.12 % MT SOLN
OROMUCOSAL | Status: AC
  Filled 2021-10-21: qty 15

## 2021-10-21 MED ORDER — PROPOFOL 10 MG/ML IV BOLUS
INTRAVENOUS | Status: AC
  Filled 2021-10-21: qty 20

## 2021-10-21 MED ORDER — BUPIVACAINE LIPOSOME 1.3 % IJ SUSP
INTRAMUSCULAR | Status: DC | PRN
  Administered 2021-10-21: 15 mL

## 2021-10-21 MED ORDER — OXYCODONE HCL 5 MG PO TABS
5.0000 mg | ORAL_TABLET | Freq: Once | ORAL | Status: AC | PRN
  Administered 2021-10-21: 5 mg via ORAL

## 2021-10-21 MED ORDER — LACTATED RINGERS IV SOLN
INTRAVENOUS | Status: DC
Start: 2021-10-21 — End: 2021-10-21

## 2021-10-21 MED ORDER — DEXAMETHASONE SODIUM PHOSPHATE 10 MG/ML IJ SOLN
INTRAMUSCULAR | Status: AC
  Filled 2021-10-21: qty 1

## 2021-10-21 MED ORDER — ACETAMINOPHEN 10 MG/ML IV SOLN
1000.0000 mg | Freq: Once | INTRAVENOUS | Status: DC | PRN
  Administered 2021-10-21: 1000 mg via INTRAVENOUS

## 2021-10-21 MED ORDER — OXYCODONE HCL 5 MG/5ML PO SOLN: 5.0000 mg | Freq: Once | ORAL | Status: AC | PRN

## 2021-10-21 MED ORDER — CLINDAMYCIN PHOSPHATE 900 MG/50ML IV SOLN
900.0000 mg | INTRAVENOUS | Status: AC
  Administered 2021-10-21: 900 mg via INTRAVENOUS

## 2021-10-21 MED ORDER — FENTANYL CITRATE (PF) 100 MCG/2ML IJ SOLN: 25.0000 ug | INTRAMUSCULAR | Status: DC | PRN

## 2021-10-21 MED ORDER — METOCLOPRAMIDE HCL 5 MG/ML IJ SOLN
10.0000 mg | Freq: Once | INTRAMUSCULAR | Status: AC
  Administered 2021-10-21: 10 mg via INTRAVENOUS

## 2021-10-21 MED ORDER — LIDOCAINE HCL (CARDIAC) PF 100 MG/5ML IV SOSY
PREFILLED_SYRINGE | INTRAVENOUS | Status: DC | PRN
  Administered 2021-10-21: 100 mg via INTRAVENOUS

## 2021-10-21 MED ORDER — METOCLOPRAMIDE HCL 10 MG PO TABS: 5.0000 mg | ORAL_TABLET | Freq: Three times a day (TID) | ORAL | Status: DC | PRN

## 2021-10-21 MED ORDER — METOCLOPRAMIDE HCL 5 MG/ML IJ SOLN: 5.0000 mg | Freq: Three times a day (TID) | INTRAMUSCULAR | Status: DC | PRN

## 2021-10-21 MED ORDER — METOCLOPRAMIDE HCL 5 MG/ML IJ SOLN
INTRAMUSCULAR | Status: AC
  Filled 2021-10-21: qty 2

## 2021-10-21 MED ORDER — FENTANYL CITRATE (PF) 100 MCG/2ML IJ SOLN
INTRAMUSCULAR | Status: DC | PRN
  Administered 2021-10-21 (×2): 50 ug via INTRAVENOUS

## 2021-10-21 MED ORDER — ONDANSETRON HCL 4 MG PO TABS: 4.0000 mg | ORAL_TABLET | Freq: Four times a day (QID) | ORAL | Status: DC | PRN

## 2021-10-21 SURGICAL SUPPLY — 66 items
ANCH SUT 4.5 FTPRNT PEEK-OPTM (Anchor) ×1 IMPLANT
ANCHOR 4.5 FOOTPRINT ULTRA (Anchor) ×1 IMPLANT
BIT DRILL 4X4.5 FOOTPRINT STR (BIT) IMPLANT
BLADE SURG 15 STRL LF DISP TIS (BLADE) ×1 IMPLANT
BLADE SURG 15 STRL SS (BLADE) ×4
BLADE SURG MINI STRL (BLADE) ×2 IMPLANT
BNDG CMPR STD VLCR NS LF 5.8X4 (GAUZE/BANDAGES/DRESSINGS) ×2
BNDG CONFORM 2 STRL LF (GAUZE/BANDAGES/DRESSINGS) ×2 IMPLANT
BNDG CONFORM 3 STRL LF (GAUZE/BANDAGES/DRESSINGS) ×2 IMPLANT
BNDG ELASTIC 4X5.8 VLCR NS LF (GAUZE/BANDAGES/DRESSINGS) ×4 IMPLANT
BNDG ESMARK 4X12 TAN STRL LF (GAUZE/BANDAGES/DRESSINGS) ×2 IMPLANT
BNDG ESMARK 6X12 TAN STRL LF (GAUZE/BANDAGES/DRESSINGS) ×1 IMPLANT
BNDG GAUZE ELAST 4 BULKY (GAUZE/BANDAGES/DRESSINGS) ×2 IMPLANT
BOOT STEPPER DURA MED (SOFTGOODS) ×1 IMPLANT
DRAPE FLUOR MINI C-ARM 54X84 (DRAPES) ×2 IMPLANT
DRILL 4X4.5 FOOTPRINT STR (BIT) ×2
DURAPREP 26ML APPLICATOR (WOUND CARE) ×2 IMPLANT
ELECT REM PT RETURN 9FT ADLT (ELECTROSURGICAL) ×2
ELECTRODE REM PT RTRN 9FT ADLT (ELECTROSURGICAL) ×1 IMPLANT
GAUZE 4X4 16PLY ~~LOC~~+RFID DBL (SPONGE) ×1 IMPLANT
GAUZE SPONGE 4X4 12PLY STRL (GAUZE/BANDAGES/DRESSINGS) ×2 IMPLANT
GAUZE XEROFORM 1X8 LF (GAUZE/BANDAGES/DRESSINGS) ×2 IMPLANT
GLOVE SURG ENC MOIS LTX SZ7.5 (GLOVE) ×2 IMPLANT
GLOVE SURG UNDER LTX SZ8 (GLOVE) ×2 IMPLANT
GOWN STRL REUS W/ TWL XL LVL3 (GOWN DISPOSABLE) ×2 IMPLANT
GOWN STRL REUS W/TWL XL LVL3 (GOWN DISPOSABLE) ×4
HANDLE YANKAUER SUCT BULB TIP (MISCELLANEOUS) ×2 IMPLANT
KIT TURNOVER KIT A (KITS) ×2 IMPLANT
LABEL OR SOLS (LABEL) IMPLANT
MANIFOLD NEPTUNE II (INSTRUMENTS) ×2 IMPLANT
NDL FILTER BLUNT 18X1 1/2 (NEEDLE) ×1 IMPLANT
NDL HYPO 25X1 1.5 SAFETY (NEEDLE) ×3 IMPLANT
NDL MAYO CATGUT SZ5 (NEEDLE)
NDL SUT 5 .5 CRC TPR PNT MAYO (NEEDLE) ×1 IMPLANT
NEEDLE FILTER BLUNT 18X 1/2SAF (NEEDLE) ×1
NEEDLE FILTER BLUNT 18X1 1/2 (NEEDLE) ×1 IMPLANT
NEEDLE HYPO 25X1 1.5 SAFETY (NEEDLE) ×6 IMPLANT
NS IRRIG 500ML POUR BTL (IV SOLUTION) ×3 IMPLANT
PACK EXTREMITY ARMC (MISCELLANEOUS) ×2 IMPLANT
RASP SM TEAR CROSS CUT (RASP) ×1 IMPLANT
SPLINT CAST 1 STEP 5X30 WHT (MISCELLANEOUS) ×1 IMPLANT
SPLINT FAST PLASTER 5X30 (CAST SUPPLIES)
SPLINT PLASTER CAST FAST 5X30 (CAST SUPPLIES) ×1 IMPLANT
SPONGE T-LAP 18X18 ~~LOC~~+RFID (SPONGE) ×2 IMPLANT
STOCKINETTE M/LG 89821 (MISCELLANEOUS) ×2 IMPLANT
STRIP CLOSURE SKIN 1/2X4 (GAUZE/BANDAGES/DRESSINGS) ×2 IMPLANT
SUT MNCRL 4-0 (SUTURE) ×2
SUT MNCRL 4-0 27XMFL (SUTURE) ×1
SUT PDS AB 0 CT1 27 (SUTURE) IMPLANT
SUT ULTRABRAID #2 38 (SUTURE) ×2 IMPLANT
SUT VIC AB 0 SH 27 (SUTURE) ×2 IMPLANT
SUT VIC AB 2-0 SH 27 (SUTURE) ×4
SUT VIC AB 2-0 SH 27XBRD (SUTURE) ×2 IMPLANT
SUT VIC AB 3-0 SH 27 (SUTURE) ×4
SUT VIC AB 3-0 SH 27X BRD (SUTURE) ×1 IMPLANT
SUT VIC AB 4-0 FS2 27 (SUTURE) ×2 IMPLANT
SUT VIC AB 4-0 SH 27 (SUTURE) ×2
SUT VIC AB 4-0 SH 27XANBCTRL (SUTURE) IMPLANT
SUT VICRYL AB 3-0 FS1 BRD 27IN (SUTURE) ×2 IMPLANT
SUT VICRYL+ 3-0 36IN CT-1 (SUTURE) ×1 IMPLANT
SUTURE MNCRL 4-0 27XMF (SUTURE) ×1 IMPLANT
SWABSTK COMLB BENZOIN TINCTURE (MISCELLANEOUS) ×1 IMPLANT
SYR 10ML LL (SYRINGE) ×4 IMPLANT
SYR 3ML LL SCALE MARK (SYRINGE) ×1 IMPLANT
WAND TOPAZ MICRO DEBRIDER (MISCELLANEOUS) ×1 IMPLANT
WATER STERILE IRR 500ML POUR (IV SOLUTION) ×2 IMPLANT

## 2021-10-21 NOTE — Transfer of Care (Signed)
Immediate Anesthesia Transfer of Care Note  Patient: Shannon Ross  Procedure(s) Performed: ACHILLES TENDON REPAIR - SECONDARY (Right: Heel) OSTECTOMY - HAGLUNDS/RETROCALCANEAL (Right: Heel)  Patient Location: PACU  Anesthesia Type:General  Level of Consciousness: awake and drowsy  Airway & Oxygen Therapy: Patient Spontanous Breathing and Patient connected to face mask oxygen  Post-op Assessment: Report given to RN and Post -op Vital signs reviewed and stable  Post vital signs: Reviewed and stable  Last Vitals:  Vitals Value Taken Time  BP 142/55 10/21/21 1033  Temp    Pulse 85 10/21/21 1039  Resp 19 10/21/21 1039  SpO2 98 % 10/21/21 1039  Vitals shown include unvalidated device data.  Last Pain:  Vitals:   10/21/21 1033  TempSrc:   PainSc: Asleep         Complications: No notable events documented.

## 2021-10-21 NOTE — H&P (Signed)
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  The various methods of treatment have been discussed with the patient.  No guarantees were given.  After consideration of risks, benefits and other options for treatment, the patient has consented to surgery.  I have reviewed the patients chart and labs.     Ross history and physical examination was performed in my office.  The patient was reexamined.  There have been no changes to this history and physical examination.  Shannon Ross

## 2021-10-21 NOTE — Op Note (Signed)
Operative note   Surgeon:Dunbar Buras Lawyer: None    Preop diagnosis: 1.  Right Achilles insertional tendinitis with tendinosis 2.  Posterior right calcaneal exostosis    Postop diagnosis: Same    Procedure: 1.  Achilles tendon repair secondary right heel 2.  Right calcaneal exostectomy    EBL: Minimal    Anesthesia:local and general.  Local consisted of a total of 5 cc of 0.25% bupivacaine with epinephrine and 15 cc of Exparel long-acting anesthetic    Hemostasis: Bupivacaine with epinephrine infiltrated along the incision site    Specimen: None    Complications: None    Operative indications:Shannon Ross is an 51 y.o. that presents today for surgical intervention.  The risks/benefits/alternatives/complications have been discussed and consent has been given.    Procedure:  Patient was brought into the OR and placed on the operating table in theprone position. After anesthesia was obtained theright lower extremity was prepped and draped in usual sterile fashion.  Attention was directed to the posterior aspect of the heel where at the Achilles insertional site a longitudinal incision was performed.  Sharp and blunt fashion was carried down to the peritenon.  Peritenon was then opened.  A longitudinal splitting incision was made within the Achilles tendon down to the level of the calcaneus.  Medial and lateral dissection was then carried out.  At this time a prominent exostosis was noted.  A large portion of the exostosis was loose.  This was excised.  The remaining bony prominence was also recontoured and removed with the use of a power rasp and a curved osteotome.  Good reduction and prominence was noted throughout the entire area.  The wound was flushed with copious amounts of irrigation.  At this time attention was directed to the Achilles itself.  There was noted to be a large amount of fibrotic tendon at the Achilles insertional site.  This was excised sharply.  This was  taken down to healthy tendon.  The Topaz wand was then used to microdebrider the tendon itself.  The splitting incision was then reapproximated with a 3-0 Vicryl.  Next a Magnum wire was used and a Krakw technique from distal to proximal and then back proximal to distal.  A 5.5 mm footprint bone anchor was then placed into the posterior calcaneus under tension and good stability was noted.  All areas were flushed finally with copious amounts of irrigation.  Closure was performed with a 3-0 Vicryl the peritenon and subcutaneous tissue and 4-0 Vicryl for the subcutaneous tissue and a 4-0 Monocryl for the skin.  Patient was placed in a equalizer walker boot at 90 degrees neutral.    Patient tolerated the procedure and anesthesia well.  Was transported from the OR to the PACU with all vital signs stable and vascular status intact. To be discharged per routine protocol.  Will follow up in approximately 1 week in the outpatient clinic.

## 2021-10-21 NOTE — Anesthesia Preprocedure Evaluation (Addendum)
Anesthesia Evaluation  Patient identified by MRN, date of birth, ID band Patient awake    Reviewed: Allergy & Precautions, NPO status , Patient's Chart, lab work & pertinent test results  History of Anesthesia Complications (+) PONV and history of anesthetic complications  Airway Mallampati: III  TM Distance: >3 FB Neck ROM: full    Dental no notable dental hx.    Pulmonary sleep apnea and Continuous Positive Airway Pressure Ventilation ,  Recent episode of bronchitis   Pulmonary exam normal        Cardiovascular Exercise Tolerance: Good hypertension (h/o HTN, no  medication), Normal cardiovascular exam     Neuro/Psych  Headaches, negative psych ROS   GI/Hepatic Neg liver ROS, GERD  Medicated and Poorly Controlled,  Endo/Other  negative endocrine ROS  Renal/GU      Musculoskeletal  (+) Fibromyalgia -  Abdominal (+) + obese,   Peds  Hematology negative hematology ROS (+)   Anesthesia Other Findings Past Medical History: No date: Abdominal pain, left lower quadrant 08/2021: Bronchitis     Comment:  bronchitis has resolved No date: Complication of anesthesia No date: Cough due to bronchospasm No date: Endometriosis No date: Family history of adverse reaction to anesthesia     Comment:  maternal grandmother-n/v No date: Fatigue No date: Fibromyalgia No date: GERD (gastroesophageal reflux disease) No date: Headache     Comment:  migraines No date: Heartburn No date: History of kidney stones 10/20/2019: Hot flashes No date: Hypertension     Comment:  PCP took pt off  meds 3 years ago due to control No date: Ovarian cyst     Comment:  Right No date: PONV (postoperative nausea and vomiting) No date: Recurrent UTI No date: Sleep apnea     Comment:  uses cpap  Past Surgical History: No date: BUNIONECTOMY No date: ESOPHAGOGASTRODUODENOSCOPY No date: KNEE ARTHROSCOPY; Left 08/16/2015: LAPAROSCOPY; N/A      Comment:  Procedure: LAPAROSCOPY OPERATIVE, RIGHT OVARIAN CYST               ASPIRATION, FULGERATION/EXCISION OF               ENDOMETRIOSIS/BIOPSIES;  Surgeon: Brayton Mars,               MD;  Location: [facility] ORS;  Service: Gynecology;                Laterality: N/A;     Reproductive/Obstetrics negative OB ROS                            Anesthesia Physical Anesthesia Plan  ASA: 2  Anesthesia Plan: General   Post-op Pain Management: Ofirmev IV (intra-op) and Regional block   Induction: Intravenous and Rapid sequence  PONV Risk Score and Plan: Dexamethasone, Ondansetron, Midazolam, Treatment may vary due to age or medical condition, Metaclopromide and Aprepitant  Airway Management Planned: Oral ETT  Additional Equipment:   Intra-op Plan:   Post-operative Plan: Extubation in OR  Informed Consent: I have reviewed the patients History and Physical, chart, labs and discussed the procedure including the risks, benefits and alternatives for the proposed anesthesia with the patient or authorized representative who has indicated his/her understanding and acceptance.     Dental Advisory Given  Plan Discussed with: Anesthesiologist, CRNA and Surgeon  Anesthesia Plan Comments:        Anesthesia Quick Evaluation

## 2021-10-21 NOTE — Anesthesia Postprocedure Evaluation (Signed)
Anesthesia Post Note  Patient: Shannon Ross  Procedure(s) Performed: ACHILLES TENDON REPAIR - SECONDARY (Right: Heel) OSTECTOMY - HAGLUNDS/RETROCALCANEAL (Right: Heel)  Patient location during evaluation: PACU Anesthesia Type: General Level of consciousness: awake and alert Pain management: pain level controlled Vital Signs Assessment: post-procedure vital signs reviewed and stable Respiratory status: spontaneous breathing, nonlabored ventilation and respiratory function stable Cardiovascular status: blood pressure returned to baseline and stable Postop Assessment: no apparent nausea or vomiting Anesthetic complications: no   No notable events documented.   Last Vitals:  Vitals:   10/21/21 1146 10/21/21 1210  BP: (!) 115/53 (!) 154/84  Pulse: 72 96  Resp: 16 15  Temp: 36.6 C (!) 36.4 C  SpO2: 97% 94%    Last Pain:  Vitals:   10/21/21 1210  TempSrc: Temporal  PainSc: [name]

## 2021-10-21 NOTE — Discharge Instructions (Addendum)
[place]  POST OPERATIVE INSTRUCTIONS FOR DR. Vickki Muff AND DR. [place]   Take your medication as prescribed.  Pain medication should be taken only as needed.  Keep the dressing clean, dry and intact.  Keep your foot elevated above the heart level for the first 48 hours.  Walking to the bathroom and brief periods of walking are acceptable, unless we have instructed you to be non-weight bearing.  Always use your crutches if you are to be non-weight bearing.  Do not take a shower. Baths are permissible as long as the foot is kept out of the water.   Every hour you are awake:  Bend your knee 15 times.   Call [facility] [phone]) if any of the following problems occur: You develop a temperature or fever. The bandage becomes saturated with blood. Medication does not stop your pain. Injury of the foot occurs. Any symptoms of infection including redness, odor, or red streaks running from wound.    AMBULATORY SURGERY  DISCHARGE INSTRUCTIONS   The drugs that you were given will stay in your system until tomorrow so for the next 24 hours you should not:  Drive an automobile Make any legal decisions Drink any alcoholic beverage   You may resume regular meals tomorrow.  Today it is better to start with liquids and gradually work up to solid foods.  You may eat anything you prefer, but it is better to start with liquids, then soup and crackers, and gradually work up to solid foods.   Please notify your doctor immediately if you have any unusual bleeding, trouble breathing, redness and pain at the surgery site, drainage, fever, or pain not relieved by medication.    Additional Instructions:        Please contact your physician with any problems or Same Day Surgery at [phone], Monday through Friday 6 am to 4 pm, or [place] at [facility] number at [phone].

## 2021-10-21 NOTE — Anesthesia Procedure Notes (Signed)
Procedure Name: Intubation Date/Time: 10/21/2021 8:48 AM Performed by: Cammie Sickle, CRNA Pre-anesthesia Checklist: Patient identified, Emergency Drugs available, Suction available and Patient being monitored Patient Re-evaluated:Patient Re-evaluated prior to induction Oxygen Delivery Method: Circle system utilized Preoxygenation: Pre-oxygenation with 100% oxygen Induction Type: IV induction, Rapid sequence and Cricoid Pressure applied Laryngoscope Size: McGraph and 3 Grade View: Grade I Tube type: Oral Tube size: 7.0 mm Number of attempts: 1 Airway Equipment and Method: Stylet and Oral airway Placement Confirmation: ETT inserted through vocal cords under direct vision, positive ETCO2 and breath sounds checked- equal and bilateral Secured at: 22 cm Tube secured with: Tape Dental Injury: Teeth and Oropharynx as per pre-operative assessment

## 2021-10-23 ENCOUNTER — Other Ambulatory Visit: Payer: Self-pay | Admitting: Podiatry

## 2021-10-23 MED ORDER — HYDROCODONE-ACETAMINOPHEN 5-325 MG PO TABS: 1.0000 | ORAL_TABLET | Freq: Four times a day (QID) | ORAL | 0 refills | Status: DC | PRN

## 2021-10-23 NOTE — Addendum Note (Signed)
Addended by: Caroline More on: 10/23/2021 11:53 AM   Modules accepted: Orders

## 2021-10-23 NOTE — Progress Notes (Signed)
Pt called stating allergic reaction to percocet.  Unable to send in e-script for norco 5 instead due to medical software being down with [facility] epic currently.  Discontinue percocet will send norco when available.

## 2021-11-10 ENCOUNTER — Other Ambulatory Visit: Payer: Self-pay

## 2021-11-10 ENCOUNTER — Other Ambulatory Visit: Payer: Self-pay | Admitting: Podiatry

## 2021-11-10 ENCOUNTER — Ambulatory Visit
Admission: RE | Admit: 2021-11-10 | Discharge: 2021-11-10 | Disposition: A | Discharge status: Home / Self Care | Payer: BC Managed Care – PPO | Source: Ambulatory Visit | Attending: Podiatry | Admitting: Podiatry

## 2021-11-10 DIAGNOSIS — M79661 Pain in right lower leg: Secondary | ICD-10-CM | POA: Diagnosis present

## 2021-11-10 DIAGNOSIS — M7989 Other specified soft tissue disorders: Secondary | ICD-10-CM | POA: Insufficient documentation

## 2022-07-18 ENCOUNTER — Other Ambulatory Visit: Payer: Self-pay | Admitting: Family Medicine

## 2022-07-18 DIAGNOSIS — Z1231 Encounter for screening mammogram for malignant neoplasm of breast: Secondary | ICD-10-CM

## 2022-08-04 ENCOUNTER — Encounter: Payer: Self-pay | Admitting: *Deleted

## 2022-08-07 ENCOUNTER — Encounter
Admission: RE | Disposition: A | Discharge status: Home / Self Care | Payer: Self-pay | Source: Home / Self Care | Attending: Gastroenterology

## 2022-08-07 ENCOUNTER — Ambulatory Visit
Admission: RE | Admit: 2022-08-07 | Discharge: 2022-08-07 | Disposition: A | Discharge status: Home / Self Care | Payer: BC Managed Care – PPO | Attending: Gastroenterology | Admitting: Gastroenterology

## 2022-08-07 ENCOUNTER — Encounter: Payer: Self-pay | Admitting: *Deleted

## 2022-08-07 ENCOUNTER — Ambulatory Visit: Payer: BC Managed Care – PPO | Admitting: Certified Registered"

## 2022-08-07 ENCOUNTER — Other Ambulatory Visit: Payer: Self-pay

## 2022-08-07 DIAGNOSIS — K219 Gastro-esophageal reflux disease without esophagitis: Secondary | ICD-10-CM | POA: Insufficient documentation

## 2022-08-07 DIAGNOSIS — M797 Fibromyalgia: Secondary | ICD-10-CM | POA: Diagnosis not present

## 2022-08-07 DIAGNOSIS — L570 Actinic keratosis: Secondary | ICD-10-CM | POA: Insufficient documentation

## 2022-08-07 DIAGNOSIS — K640 First degree hemorrhoids: Secondary | ICD-10-CM | POA: Diagnosis not present

## 2022-08-07 DIAGNOSIS — I10 Essential (primary) hypertension: Secondary | ICD-10-CM | POA: Insufficient documentation

## 2022-08-07 DIAGNOSIS — G4730 Sleep apnea, unspecified: Secondary | ICD-10-CM | POA: Diagnosis not present

## 2022-08-07 DIAGNOSIS — Z1211 Encounter for screening for malignant neoplasm of colon: Secondary | ICD-10-CM | POA: Insufficient documentation

## 2022-08-07 DIAGNOSIS — K449 Diaphragmatic hernia without obstruction or gangrene: Secondary | ICD-10-CM | POA: Diagnosis not present

## 2022-08-07 DIAGNOSIS — Z79899 Other long term (current) drug therapy: Secondary | ICD-10-CM | POA: Diagnosis not present

## 2022-08-07 DIAGNOSIS — K3189 Other diseases of stomach and duodenum: Secondary | ICD-10-CM | POA: Insufficient documentation

## 2022-08-07 DIAGNOSIS — K2950 Unspecified chronic gastritis without bleeding: Secondary | ICD-10-CM | POA: Insufficient documentation

## 2022-08-07 HISTORY — PX: ESOPHAGOGASTRODUODENOSCOPY (EGD) WITH PROPOFOL: SHX5813

## 2022-08-07 HISTORY — PX: COLONOSCOPY WITH PROPOFOL: SHX5780

## 2022-08-07 LAB — POCT PREGNANCY, URINE: Preg Test, Ur: NEGATIVE

## 2022-08-07 SURGERY — COLONOSCOPY WITH PROPOFOL
Anesthesia: General

## 2022-08-07 MED ORDER — ONDANSETRON HCL 4 MG/2ML IJ SOLN
INTRAMUSCULAR | Status: DC | PRN
  Administered 2022-08-07: 4 mg via INTRAVENOUS

## 2022-08-07 MED ORDER — SODIUM CHLORIDE 0.9 % IV SOLN: INTRAVENOUS | Status: DC

## 2022-08-07 MED ORDER — PROPOFOL 500 MG/50ML IV EMUL
INTRAVENOUS | Status: DC | PRN
  Administered 2022-08-07: 150 ug/kg/min via INTRAVENOUS

## 2022-08-07 MED ORDER — DEXMEDETOMIDINE HCL IN NACL 200 MCG/50ML IV SOLN
INTRAVENOUS | Status: DC | PRN
  Administered 2022-08-07: 12 ug via INTRAVENOUS

## 2022-08-07 MED ORDER — PROPOFOL 10 MG/ML IV BOLUS
INTRAVENOUS | Status: DC | PRN
  Administered 2022-08-07: 100 mg via INTRAVENOUS
  Administered 2022-08-07 (×2): 10 mg via INTRAVENOUS
  Administered 2022-08-07: 40 mg via INTRAVENOUS

## 2022-08-07 MED ORDER — LIDOCAINE HCL (CARDIAC) PF 100 MG/5ML IV SOSY
PREFILLED_SYRINGE | INTRAVENOUS | Status: DC | PRN
  Administered 2022-08-07: 100 mg via INTRAVENOUS

## 2022-08-07 NOTE — Anesthesia Procedure Notes (Signed)
Procedure Name: MAC Date/Time: 08/07/2022 12:50 PM  Performed by: Biagio Borg, CRNAPre-anesthesia Checklist: Patient identified, Emergency Drugs available, Suction available, Patient being monitored and Timeout performed Patient Re-evaluated:Patient Re-evaluated prior to induction Oxygen Delivery Method: Nasal cannula Induction Type: IV induction Placement Confirmation: positive ETCO2 and CO2 detector

## 2022-08-07 NOTE — H&P (Signed)
Outpatient short stay form Pre-procedure 08/07/2022  Regis Bill, MD  Primary Physician: Lyman Bishop Harvie Junior, FNP  Reason for visit:  Vomiting/Colon cancer screening  History of present illness:    51 y/o gentleman with lady with history of GERD and obesity here for vomiting and colon cancer screening. No blood thinners. No family history of GI malignancies. No significant abdominal surgeries.    Current Facility-Administered Medications:    0.9 %  sodium chloride infusion, , Intravenous, Continuous, Jayna Mulnix, Rossie Muskrat, MD, Last Rate: 20 mL/hr at 08/07/22 1231, New Bag at 08/07/22 1231  Medications Prior to Admission  Medication Sig Dispense Refill Last Dose   albuterol (VENTOLIN HFA) 108 (90 Base) MCG/ACT inhaler Inhale 1-2 puffs into the lungs every 6 (six) hours as needed for wheezing or shortness of breath.   08/06/2022   buPROPion (WELLBUTRIN SR) 150 MG 12 hr tablet Take 150 mg by mouth every morning.   Past Week   levonorgestrel (MIRENA) 20 MCG/24HR IUD 1 each by Intrauterine route once.   08/07/2022   naproxen sodium (ALEVE) 220 MG tablet Take 220-440 mg by mouth 2 (two) times daily as needed (pain).   Past Week   omeprazole (PRILOSEC) 40 MG capsule TAKE 1 CAPSULE(40 MG) BY MOUTH TWICE DAILY (Patient taking differently: Take 40 mg by mouth See admin instructions. Take 40 mg daily, may take a second 40 mg dose as needed for acid reflux) 60 capsule 2 Past Week   oxymetazoline (AFRIN) 0.05 % nasal spray Place 1 spray into both nostrils 2 (two) times daily as needed for congestion.   Past Week   venlafaxine (EFFEXOR) 75 MG tablet Take 75 mg by mouth every morning.   Past Week     Allergies  Allergen Reactions   Amoxicillin-Pot Clavulanate Anaphylaxis, Hives, Itching and Swelling   Sulfa Antibiotics Hives and Swelling    Tongue swelling      Past Medical History:  Diagnosis Date   Abdominal pain, left lower quadrant    Bronchitis 08/2021   bronchitis has resolved    Complication of anesthesia    Cough due to bronchospasm    Endometriosis    Family history of adverse reaction to anesthesia    maternal grandmother-n/v   Fatigue    Fibromyalgia    GERD (gastroesophageal reflux disease)    Headache    migraines   Heartburn    History of kidney stones    Hot flashes 10/20/2019   Hypertension    PCP took pt off  meds 3 years ago due to control   Ovarian cyst    Right   PONV (postoperative nausea and vomiting)    Recurrent UTI    Sleep apnea    uses cpap    Review of systems:  Otherwise negative.    Physical Exam  Gen: Alert, oriented. Appears stated age.  HEENT: PERRLA. Lungs: No respiratory distress CV: RRR Abd: soft, benign, no masses Ext: No edema    Planned procedures: Proceed with EGD/colonoscopy. The patient understands the nature of the planned procedure, indications, risks, alternatives and potential complications including but not limited to bleeding, infection, perforation, damage to internal organs and possible oversedation/side effects from anesthesia. The patient agrees and gives consent to proceed.  Please refer to procedure notes for findings, recommendations and patient disposition/instructions.     Regis Bill, MD [facility]astroenterology

## 2022-08-07 NOTE — Op Note (Signed)
[facility] Gastroenterology Patient Name: Shannon Ross Procedure Date: 08/07/2022 12:41 PM MRN: [id number] Account #: [id number] Date of Birth: 1971/08/16 Admit Type: Outpatient Age: 51 Room: [facility] ENDO ROOM 3 Gender: Female Note Status: Finalized Instrument Name: Michaelle Birks [id number] Procedure:             Upper GI endoscopy Indications:           Dyspepsia, Vomiting Providers:             Andrey Farmer MD, MD Referring MD:          No Local Md, MD (Referring MD) Medicines:             Monitored Anesthesia Care Complications:         No immediate complications. Estimated blood loss:                         Minimal. Procedure:             Pre-Anesthesia Assessment:                        - Prior to the procedure, a History and Physical was                         performed, and patient medications and allergies were                         reviewed. The patient is competent. The risks and                         benefits of the procedure and the sedation options and                         risks were discussed with the patient. All questions                         were answered and informed consent was obtained.                         Patient identification and proposed procedure were                         verified by the physician, the nurse, the                         anesthesiologist, the anesthetist and the technician                         in the endoscopy suite. Mental Status Examination:                         alert and oriented. Airway Examination: normal                         oropharyngeal airway and neck mobility. Respiratory                         Examination: clear to auscultation. CV Examination:  normal. Prophylactic Antibiotics: The patient does not                         require prophylactic antibiotics. Prior                         Anticoagulants: The patient has taken no anticoagulant                          or antiplatelet agents. ASA Grade Assessment: II - A                         patient with mild systemic disease. After reviewing                         the risks and benefits, the patient was deemed in                         satisfactory condition to undergo the procedure. The                         anesthesia plan was to use monitored anesthesia care                         (MAC). Immediately prior to administration of                         medications, the patient was re-assessed for adequacy                         to receive sedatives. The heart rate, respiratory                         rate, oxygen saturations, blood pressure, adequacy of                         pulmonary ventilation, and response to care were                         monitored throughout the procedure. The physical                         status of the patient was re-assessed after the                         procedure.                        After obtaining informed consent, the endoscope was                         passed under direct vision. Throughout the procedure,                         the patient's blood pressure, pulse, and oxygen                         saturations were monitored continuously. The Endoscope  was introduced through the mouth, and advanced to the                         second part of duodenum. The upper GI endoscopy was                         accomplished without difficulty. The patient tolerated                         the procedure well. Findings:      A 4 cm hiatal hernia was present.      The exam of the esophagus was otherwise normal.      Biopsies were taken with a cold forceps in the entire esophagus for       histology. Estimated blood loss was minimal.      A single 4 mm papule (nodule) with no bleeding and no stigmata of recent       bleeding was found in the gastric fundus. Biopsies were taken with a       cold forceps for histology. Estimated  blood loss was minimal.      Multiple small sessile fundic gland polyps with no bleeding and no       stigmata of recent bleeding were found in the gastric body.      Patchy moderate inflammation characterized by erythema was found in the       gastric antrum. Biopsies were taken with a cold forceps for Helicobacter       pylori testing. Estimated blood loss was minimal.      The examined duodenum was normal. Impression:            - 4 cm hiatal hernia.                        - A single papule (nodule) found in the stomach.                         Biopsied.                        - Multiple fundic gland polyps.                        - Gastritis. Biopsied.                        - Normal examined duodenum.                        - Biopsies were taken with a cold forceps for                         histology in the entire esophagus. Recommendation:        - Discharge patient to home.                        - Resume previous diet.                        - Continue present medications.                        -  Await pathology results.                        - Return to referring physician as previously                         scheduled. Procedure Code(s):     --- Professional ---                        [id number], Esophagogastroduodenoscopy, flexible,                         transoral; with biopsy, single or multiple Diagnosis Code(s):     --- Professional ---                        K44.9, Diaphragmatic hernia without obstruction or                         gangrene                        K31.89, Other diseases of stomach and duodenum                        K31.7, Polyp of stomach and duodenum                        K29.70, Gastritis, unspecified, without bleeding                        R10.13, Epigastric pain                        R11.10, Vomiting, unspecified CPT copyright 2022 American Medical Association. All rights reserved. The codes documented in this report are preliminary and upon  coder review may  be revised to meet current compliance requirements. Andrey Farmer MD, MD 08/07/2022 1:16:54 PM Number of Addenda: 0 Note Initiated On: 08/07/2022 12:41 PM Estimated Blood Loss:  Estimated blood loss was minimal.      [facility]

## 2022-08-07 NOTE — Interval H&P Note (Signed)
History and Physical Interval Note:  08/07/2022 12:34 PM  Shannon Ross  has presented today for surgery, with the diagnosis of colon cancer screening dysphagia gerd.  The various methods of treatment have been discussed with the patient and family. After consideration of risks, benefits and other options for treatment, the patient has consented to  Procedure(s): COLONOSCOPY WITH PROPOFOL (N/A) ESOPHAGOGASTRODUODENOSCOPY (EGD) WITH PROPOFOL (N/A) as a surgical intervention.  The patient's history has been reviewed, patient examined, no change in status, stable for surgery.  I have reviewed the patient's chart and labs.  Questions were answered to the patient's satisfaction.     Regis Bill  Ok to proceed with EGD/Colonoscopy

## 2022-08-07 NOTE — Anesthesia Postprocedure Evaluation (Signed)
Anesthesia Post Note  Patient: Shannon Ross  Procedure(s) Performed: COLONOSCOPY WITH PROPOFOL ESOPHAGOGASTRODUODENOSCOPY (EGD) WITH PROPOFOL  Patient location during evaluation: PACU Anesthesia Type: General Level of consciousness: awake and awake and alert Pain management: satisfactory to patient Vital Signs Assessment: post-procedure vital signs reviewed and stable Respiratory status: spontaneous breathing Cardiovascular status: stable Anesthetic complications: no  No notable events documented.   Last Vitals:  Vitals:   08/07/22 1314 08/07/22 1330  BP: 134/71 135/63  Pulse:    Resp:    Temp: (!) 36.3 C   SpO2:      Last Pain:  Vitals:   08/07/22 1330  TempSrc:   PainSc: 0-No pain                 VAN STAVEREN,Estrella Alcaraz

## 2022-08-07 NOTE — Transfer of Care (Signed)
Immediate Anesthesia Transfer of Care Note  Patient: Shannon Ross  Procedure(s) Performed: COLONOSCOPY WITH PROPOFOL ESOPHAGOGASTRODUODENOSCOPY (EGD) WITH PROPOFOL  Patient Location: PACU and Endoscopy Unit  Anesthesia Type:General  Level of Consciousness: awake  Airway & Oxygen Therapy: Patient Spontanous Breathing  Post-op Assessment: Report given to RN and Post -op Vital signs reviewed and stable  Post vital signs: Reviewed and stable  Last Vitals:  Vitals Value Taken Time  BP    Temp    Pulse 102 08/07/22 1314  Resp 19 08/07/22 1314  SpO2 97 % 08/07/22 1314  Vitals shown include unvalidated device data.  Last Pain:  Vitals:   08/07/22 1218  TempSrc: Temporal  PainSc: 0-No pain         Complications: No notable events documented.

## 2022-08-07 NOTE — Op Note (Signed)
[facility] Patient Name: Shannon Ross Procedure Date: 08/07/2022 12:41 PM MRN: [id number] Account #: [id number] Date of Birth: Dec 10, 1970 Admit Type: Outpatient Age: 51 Room: [facility] ENDO ROOM 3 Gender: Female Note Status: Finalized Instrument Name: Jasper Riling [id number] Procedure:             Colonoscopy Indications:           Screening for colorectal malignant neoplasm Providers:             Andrey Farmer MD, MD Referring MD:          No Local Md, MD (Referring MD) Medicines:             Monitored Anesthesia Care Complications:         No immediate complications. Procedure:             Pre-Anesthesia Assessment:                        - Prior to the procedure, a History and Physical was                         performed, and patient medications and allergies were                         reviewed. The patient is competent. The risks and                         benefits of the procedure and the sedation options and                         risks were discussed with the patient. All questions                         were answered and informed consent was obtained.                         Patient identification and proposed procedure were                         verified by the physician, the nurse, the                         anesthesiologist, the anesthetist and the technician                         in the endoscopy suite. Mental Status Examination:                         alert and oriented. Airway Examination: normal                         oropharyngeal airway and neck mobility. Respiratory                         Examination: clear to auscultation. CV Examination:                         normal. Prophylactic Antibiotics: The patient does not  require prophylactic antibiotics. Prior                         Anticoagulants: The patient has taken no anticoagulant                         or antiplatelet agents. ASA Grade  Assessment: II - A                         patient with mild systemic disease. After reviewing                         the risks and benefits, the patient was deemed in                         satisfactory condition to undergo the procedure. The                         anesthesia plan was to use monitored anesthesia care                         (MAC). Immediately prior to administration of                         medications, the patient was re-assessed for adequacy                         to receive sedatives. The heart rate, respiratory                         rate, oxygen saturations, blood pressure, adequacy of                         pulmonary ventilation, and response to care were                         monitored throughout the procedure. The physical                         status of the patient was re-assessed after the                         procedure.                        After obtaining informed consent, the colonoscope was                         passed under direct vision. Throughout the procedure,                         the patient's blood pressure, pulse, and oxygen                         saturations were monitored continuously. The                         Colonoscope was introduced through the anus and  advanced to the the terminal ileum. The colonoscopy                         was performed without difficulty. The patient                         tolerated the procedure well. The quality of the bowel                         preparation was good. The terminal ileum, ileocecal                         valve, appendiceal orifice, and rectum were                         photographed. Findings:      The perianal and digital rectal examinations were normal.      The terminal ileum appeared normal.      Internal hemorrhoids were found during endoscopy. The hemorrhoids were       Grade I (internal hemorrhoids that do not prolapse).      Retroflexion in  the rectum was not performed. Impression:            - The examined portion of the ileum was normal.                        - Internal hemorrhoids.                        - No specimens collected. Recommendation:        - Discharge patient to home.                        - Resume previous diet.                        - Continue present medications.                        - Repeat colonoscopy in 10 years for screening                         purposes.                        - Return to referring physician as previously                         scheduled. Procedure Code(s):     --- Professional ---                        I5027, Colorectal cancer screening; colonoscopy on                         individual not meeting criteria for high risk Diagnosis Code(s):     --- Professional ---                        Z12.11, Encounter for screening for malignant neoplasm  of colon                        K64.0, First degree hemorrhoids CPT copyright 2022 [employer]. All rights reserved. The codes documented in this report are preliminary and upon coder review may  be revised to meet current compliance requirements. Andrey Farmer MD, MD 08/07/2022 1:22:18 PM Number of Addenda: 0 Note Initiated On: 08/07/2022 12:41 PM Scope Withdrawal Time: 0 hours 6 minutes 53 seconds  Total Procedure Duration: 0 hours 9 minutes 35 seconds  Estimated Blood Loss:  Estimated blood loss: none.      [facility]

## 2022-08-07 NOTE — Anesthesia Preprocedure Evaluation (Signed)
Anesthesia Evaluation  Patient identified by MRN, date of birth, ID band Patient awake    Reviewed: Allergy & Precautions, NPO status , Patient's Chart, lab work & pertinent test results  History of Anesthesia Complications (+) PONV and history of anesthetic complications  Airway Mallampati: III  TM Distance: >3 FB Neck ROM: Full    Dental  (+) Teeth Intact   Pulmonary neg pulmonary ROS, sleep apnea and Continuous Positive Airway Pressure Ventilation    Pulmonary exam normal breath sounds clear to auscultation       Cardiovascular Exercise Tolerance: Good hypertension, Pt. on medications negative cardio ROS Normal cardiovascular exam Rhythm:Regular     Neuro/Psych  Headaches negative neurological ROS  negative psych ROS   GI/Hepatic negative GI ROS, Neg liver ROS,GERD  Medicated,,  Endo/Other  negative endocrine ROS    Renal/GU negative Renal ROS  negative genitourinary   Musculoskeletal  (+)  Fibromyalgia -  Abdominal  (+) + obese  Peds negative pediatric ROS (+)  Hematology negative hematology ROS (+)   Anesthesia Other Findings Past Medical History: No date: Abdominal pain, left lower quadrant 08/2021: Bronchitis     Comment:  bronchitis has resolved No date: Complication of anesthesia No date: Cough due to bronchospasm No date: Endometriosis No date: Family history of adverse reaction to anesthesia     Comment:  maternal grandmother-n/v No date: Fatigue No date: Fibromyalgia No date: GERD (gastroesophageal reflux disease) No date: Headache     Comment:  migraines No date: Heartburn No date: History of kidney stones 10/20/2019: Hot flashes No date: Hypertension     Comment:  PCP took pt off  meds 3 years ago due to control No date: Ovarian cyst     Comment:  Right No date: PONV (postoperative nausea and vomiting) No date: Recurrent UTI No date: Sleep apnea     Comment:  uses cpap  Past  Surgical History: 10/21/2021: ACHILLES TENDON SURGERY; Right     Comment:  Procedure: ACHILLES TENDON REPAIR - SECONDARY;  Surgeon:              Gwyneth Revels, DPM;  Location: [facility] ORS;  Service:               Podiatry;  Laterality: Right; No date: BUNIONECTOMY No date: ESOPHAGOGASTRODUODENOSCOPY No date: KNEE ARTHROSCOPY; Left 08/16/2015: LAPAROSCOPY; N/A     Comment:  Procedure: LAPAROSCOPY OPERATIVE, RIGHT OVARIAN CYST               ASPIRATION, FULGERATION/EXCISION OF               ENDOMETRIOSIS/BIOPSIES;  Surgeon: Herold Harms,               MD;  Location: [facility] ORS;  Service: Gynecology;                Laterality: N/A; 10/21/2021: OSTECTOMY; Right     Comment:  Procedure: OSTECTOMY - HAGLUNDS/RETROCALCANEAL;                Surgeon: Gwyneth Revels, DPM;  Location: [facility] ORS;                Service: Podiatry;  Laterality: Right;  BMI    Body Mass Index: 37.20 kg/m      Reproductive/Obstetrics negative OB ROS                             Anesthesia Physical Anesthesia Plan  ASA: 2  Anesthesia Plan: General   Post-op Pain Management:    Induction: Intravenous  PONV Risk Score and Plan: Propofol infusion and TIVA  Airway Management Planned: Natural Airway  Additional Equipment:   Intra-op Plan:   Post-operative Plan:   Informed Consent: I have reviewed the patients History and Physical, chart, labs and discussed the procedure including the risks, benefits and alternatives for the proposed anesthesia with the patient or authorized representative who has indicated his/her understanding and acceptance.     Dental Advisory Given  Plan Discussed with: CRNA and Surgeon  Anesthesia Plan Comments:        Anesthesia Quick Evaluation

## 2022-08-08 ENCOUNTER — Encounter: Payer: Self-pay | Admitting: Gastroenterology

## 2022-08-08 NOTE — Progress Notes (Signed)
During postop phone call, patient described significant pain/discomfort under right ear extending down her right jaw line that includes swelling 'the size of a golf ball'.  States very tender to the touch, no redness noted.  Describes sore throat but denies difficulty swallowing food or liquids, no fever.  Discussed with Dr Mia Creek and he suggests she treat the pain/discomfort with Tylenol or Advil and apply ice pack to the area intermittently during the day.  Instructed her to call the ENDO unit back if she develops increased swelling in the area, difficulty swallowing or a fever.  The patient verbalizes understanding and agrees with the plan of care.

## 2022-08-09 LAB — SURGICAL PATHOLOGY

## 2022-08-16 ENCOUNTER — Other Ambulatory Visit: Payer: Self-pay | Admitting: Gastroenterology

## 2022-08-16 DIAGNOSIS — R1111 Vomiting without nausea: Secondary | ICD-10-CM

## 2022-08-16 DIAGNOSIS — R1319 Other dysphagia: Secondary | ICD-10-CM

## 2022-08-28 ENCOUNTER — Other Ambulatory Visit: Payer: BC Managed Care – PPO

## 2022-08-30 ENCOUNTER — Ambulatory Visit
Admission: RE | Admit: 2022-08-30 | Discharge: 2022-08-30 | Disposition: A | Discharge status: Home / Self Care | Payer: BC Managed Care – PPO | Source: Ambulatory Visit | Attending: Gastroenterology | Admitting: Gastroenterology

## 2022-08-30 ENCOUNTER — Other Ambulatory Visit: Payer: Self-pay | Admitting: Gastroenterology

## 2022-08-30 DIAGNOSIS — R1319 Other dysphagia: Secondary | ICD-10-CM

## 2022-08-30 DIAGNOSIS — R1111 Vomiting without nausea: Secondary | ICD-10-CM

## 2022-09-07 ENCOUNTER — Other Ambulatory Visit: Payer: BC Managed Care – PPO

## 2022-09-08 ENCOUNTER — Encounter: Admission: RE | Admit: 2022-09-08 | Payer: BC Managed Care – PPO | Source: Ambulatory Visit

## 2022-09-12 ENCOUNTER — Ambulatory Visit
Admission: RE | Admit: 2022-09-12 | Discharge: 2022-09-12 | Disposition: A | Discharge status: Home / Self Care | Payer: BC Managed Care – PPO | Source: Ambulatory Visit | Attending: Family Medicine | Admitting: Family Medicine

## 2022-09-12 DIAGNOSIS — Z1231 Encounter for screening mammogram for malignant neoplasm of breast: Secondary | ICD-10-CM | POA: Insufficient documentation

## 2024-02-25 ENCOUNTER — Other Ambulatory Visit: Payer: Self-pay | Admitting: Family Medicine

## 2024-02-25 DIAGNOSIS — Z1231 Encounter for screening mammogram for malignant neoplasm of breast: Secondary | ICD-10-CM

## 2024-03-25 ENCOUNTER — Ambulatory Visit
Admission: RE | Admit: 2024-03-25 | Discharge: 2024-03-25 | Disposition: A | Discharge status: Home / Self Care | Payer: Self-pay | Source: Ambulatory Visit | Attending: Family Medicine | Admitting: Family Medicine

## 2024-03-25 DIAGNOSIS — Z1231 Encounter for screening mammogram for malignant neoplasm of breast: Secondary | ICD-10-CM | POA: Diagnosis present
